# Patient Record
Sex: Male | Born: 1940 | Race: White | Hispanic: No | Marital: Married | Smoking: Former smoker
Health system: Southern US, Community
[De-identification: ages and names within clinical notes are randomized; demographics above are authoritative.]

## PROBLEM LIST (undated history)

## (undated) DIAGNOSIS — R7303 Prediabetes: Secondary | ICD-10-CM

## (undated) DIAGNOSIS — L821 Other seborrheic keratosis: Secondary | ICD-10-CM

## (undated) DIAGNOSIS — K648 Other hemorrhoids: Secondary | ICD-10-CM

## (undated) DIAGNOSIS — E785 Hyperlipidemia, unspecified: Secondary | ICD-10-CM

## (undated) DIAGNOSIS — R001 Bradycardia, unspecified: Secondary | ICD-10-CM

## (undated) DIAGNOSIS — K2270 Barrett's esophagus without dysplasia: Secondary | ICD-10-CM

## (undated) DIAGNOSIS — H8110 Benign paroxysmal vertigo, unspecified ear: Secondary | ICD-10-CM

## (undated) DIAGNOSIS — S8990XA Unspecified injury of unspecified lower leg, initial encounter: Secondary | ICD-10-CM

## (undated) DIAGNOSIS — N400 Enlarged prostate without lower urinary tract symptoms: Secondary | ICD-10-CM

## (undated) DIAGNOSIS — I4891 Unspecified atrial fibrillation: Secondary | ICD-10-CM

## (undated) DIAGNOSIS — M1990 Unspecified osteoarthritis, unspecified site: Secondary | ICD-10-CM

## (undated) DIAGNOSIS — K760 Fatty (change of) liver, not elsewhere classified: Secondary | ICD-10-CM

## (undated) DIAGNOSIS — G540 Brachial plexus disorders: Secondary | ICD-10-CM

## (undated) DIAGNOSIS — K621 Rectal polyp: Secondary | ICD-10-CM

## (undated) DIAGNOSIS — Z85828 Personal history of other malignant neoplasm of skin: Secondary | ICD-10-CM

## (undated) DIAGNOSIS — M0609 Rheumatoid arthritis without rheumatoid factor, multiple sites: Secondary | ICD-10-CM

## (undated) DIAGNOSIS — I10 Essential (primary) hypertension: Secondary | ICD-10-CM

## (undated) DIAGNOSIS — I493 Ventricular premature depolarization: Secondary | ICD-10-CM

## (undated) DIAGNOSIS — F325 Major depressive disorder, single episode, in full remission: Secondary | ICD-10-CM

## (undated) DIAGNOSIS — I452 Bifascicular block: Secondary | ICD-10-CM

## (undated) DIAGNOSIS — M65352 Trigger finger, left little finger: Secondary | ICD-10-CM

## (undated) DIAGNOSIS — Z950 Presence of cardiac pacemaker: Secondary | ICD-10-CM

## (undated) HISTORY — PX: SHOULDER SURGERY: SHX246

## (undated) HISTORY — DX: Essential (primary) hypertension: I10

## (undated) HISTORY — PX: INSERT / REPLACE / REMOVE PACEMAKER: SUR710

## (undated) HISTORY — PX: TONSILLECTOMY: SUR1361

---

## 2004-07-03 ENCOUNTER — Ambulatory Visit: Payer: Self-pay | Admitting: Gastroenterology

## 2004-12-22 ENCOUNTER — Ambulatory Visit: Payer: Self-pay | Admitting: Internal Medicine

## 2005-01-11 ENCOUNTER — Ambulatory Visit: Payer: Self-pay | Admitting: Internal Medicine

## 2006-12-24 ENCOUNTER — Emergency Department: Payer: Self-pay | Admitting: Emergency Medicine

## 2006-12-24 ENCOUNTER — Other Ambulatory Visit: Payer: Self-pay

## 2006-12-29 ENCOUNTER — Ambulatory Visit: Payer: Self-pay | Admitting: Emergency Medicine

## 2008-09-09 ENCOUNTER — Ambulatory Visit: Payer: Self-pay | Admitting: Unknown Physician Specialty

## 2008-10-09 ENCOUNTER — Emergency Department: Payer: Self-pay | Admitting: Internal Medicine

## 2009-07-25 ENCOUNTER — Ambulatory Visit: Payer: Self-pay | Admitting: Internal Medicine

## 2009-10-06 ENCOUNTER — Ambulatory Visit: Payer: Self-pay | Admitting: Unknown Physician Specialty

## 2010-03-31 ENCOUNTER — Ambulatory Visit: Payer: Self-pay | Admitting: Internal Medicine

## 2011-04-05 DIAGNOSIS — N320 Bladder-neck obstruction: Secondary | ICD-10-CM | POA: Insufficient documentation

## 2011-05-03 DIAGNOSIS — E782 Mixed hyperlipidemia: Secondary | ICD-10-CM | POA: Insufficient documentation

## 2011-08-29 DIAGNOSIS — M7520 Bicipital tendinitis, unspecified shoulder: Secondary | ICD-10-CM | POA: Insufficient documentation

## 2011-08-29 DIAGNOSIS — M19019 Primary osteoarthritis, unspecified shoulder: Secondary | ICD-10-CM | POA: Insufficient documentation

## 2011-08-29 DIAGNOSIS — M75122 Complete rotator cuff tear or rupture of left shoulder, not specified as traumatic: Secondary | ICD-10-CM | POA: Insufficient documentation

## 2012-02-01 DIAGNOSIS — M7502 Adhesive capsulitis of left shoulder: Secondary | ICD-10-CM | POA: Insufficient documentation

## 2012-02-15 DIAGNOSIS — G709 Myoneural disorder, unspecified: Secondary | ICD-10-CM | POA: Insufficient documentation

## 2012-06-03 DIAGNOSIS — G5600 Carpal tunnel syndrome, unspecified upper limb: Secondary | ICD-10-CM | POA: Insufficient documentation

## 2013-02-15 ENCOUNTER — Ambulatory Visit: Payer: Self-pay | Admitting: Internal Medicine

## 2013-03-09 ENCOUNTER — Ambulatory Visit (INDEPENDENT_AMBULATORY_CARE_PROVIDER_SITE_OTHER): Payer: Medicare Other | Admitting: Podiatry

## 2013-03-09 ENCOUNTER — Encounter: Payer: Self-pay | Admitting: Podiatry

## 2013-03-09 VITALS — BP 144/83 | HR 77 | Resp 16 | Ht 66.0 in | Wt 224.0 lb

## 2013-03-09 DIAGNOSIS — M779 Enthesopathy, unspecified: Secondary | ICD-10-CM

## 2013-03-09 NOTE — Patient Instructions (Signed)

## 2013-03-09 NOTE — Progress Notes (Signed)
Subjective:     Patient ID: [name], male   DOB: 13-Jan-1941, 72 y.o.   MRN: [id number]  HPI patient states that the pain is improving but still present if he walks a lot   Review of Systems  All other systems reviewed and are negative.       Objective:   Physical Exam  Nursing note and vitals reviewed. Constitutional: He appears well-developed and well-nourished.  Cardiovascular: Intact distal pulses.   Musculoskeletal: Normal range of motion.  Skin: Skin is warm.   Mild discomfort still noted second MPJ right with mild fluid buildup    Assessment:     Capsulitis second MPJ right still present    Plan:     Orthotics dispensed with instructions and advised on gradual increase in activity

## 2013-04-27 ENCOUNTER — Ambulatory Visit: Payer: Medicare Other | Admitting: Podiatry

## 2013-04-27 ENCOUNTER — Encounter: Payer: Self-pay | Admitting: Podiatry

## 2013-04-27 VITALS — BP 145/77 | HR 60 | Resp 16 | Ht 69.0 in | Wt 220.0 lb

## 2013-04-27 DIAGNOSIS — M779 Enthesopathy, unspecified: Secondary | ICD-10-CM

## 2013-04-27 DIAGNOSIS — B351 Tinea unguium: Secondary | ICD-10-CM

## 2013-04-27 MED ORDER — TRIAMCINOLONE ACETONIDE 10 MG/ML IJ SUSP
10.0000 mg | Freq: Once | INTRAMUSCULAR | Status: AC
  Administered 2013-04-27: 10 mg

## 2013-04-28 NOTE — Progress Notes (Signed)
Subjective:     Patient ID: [name], male   DOB: August 02, 1940, 72 y.o.   MRN: [id number]  HPI patient presents stating I am still having some soreness but the orthotics make a difference in my right foot   Review of Systems     Objective:   Physical Exam Neurovascular status intact with pain to palpation second MPJ right with tightness in the joint and obvious fluid accumulation    Assessment:     Capsulitis right second MPJ with possible damage the flexor plate and metatarsal    Plan:     Explained condition and possibility this may ultimately require surgery. Today patient wants injection and I went ahead and did proximal nerve block aspirated the joint and was able to apply a half cc of dexamethasone with Padding. Patient will reappoint in 6 weeks and if no improvement we'll need to consider surgery

## 2013-06-01 ENCOUNTER — Ambulatory Visit (INDEPENDENT_AMBULATORY_CARE_PROVIDER_SITE_OTHER): Payer: Medicare Other | Admitting: Podiatry

## 2013-06-01 ENCOUNTER — Encounter: Payer: Self-pay | Admitting: Podiatry

## 2013-06-01 VITALS — BP 129/82 | HR 52 | Resp 14

## 2013-06-01 DIAGNOSIS — M779 Enthesopathy, unspecified: Secondary | ICD-10-CM

## 2013-06-01 DIAGNOSIS — Q828 Other specified congenital malformations of skin: Secondary | ICD-10-CM

## 2013-06-01 MED ORDER — TRIAMCINOLONE ACETONIDE 10 MG/ML IJ SUSP
10.0000 mg | Freq: Once | INTRAMUSCULAR | Status: AC
  Administered 2013-06-01: 10 mg

## 2013-06-01 NOTE — Progress Notes (Signed)
Subjective:     Patient ID: [name], male   DOB: 11/02/1940, 73 y.o.   MRN: [id number]  HPI patient presents with lesion on the outside of the right foot that has become very painful. The area of the second MPJ has improved quite a bit with minimal discomfort   Review of Systems     Objective:   Physical Exam Neurovascular status intact with minimal pain second MPJ but quite a bit of pain in the fifth MPJ right with keratotic lesion formation x2    Assessment:     Inflamed capsulitis right fifth MPJ with porokeratotic type lesions and improving second MPJ inflammation    Plan:     Reviewed outside and did a careful steroid injection and after appropriate numbness debridement the lesion fully and applied a Band-Aid. Reappoint to recheck

## 2013-06-01 NOTE — Progress Notes (Signed)
   Subjective:    Patient ID: [name], male    DOB: [birth year], 73 y.o.   MRN: [id number]  HPI Comments: The side of the right foot where is was shaved before is very sore and tender , right lateral 5th met      Review of Systems     Objective:   Physical Exam        Assessment & Plan:

## 2013-06-08 ENCOUNTER — Encounter: Payer: Self-pay | Admitting: Podiatry

## 2013-06-08 ENCOUNTER — Ambulatory Visit (INDEPENDENT_AMBULATORY_CARE_PROVIDER_SITE_OTHER): Payer: Medicare Other | Admitting: Podiatry

## 2013-06-08 VITALS — BP 130/79 | HR 58 | Resp 16

## 2013-06-08 DIAGNOSIS — M779 Enthesopathy, unspecified: Secondary | ICD-10-CM

## 2013-06-08 NOTE — Progress Notes (Signed)
Subjective:     Patient ID: [name], male   DOB: April 07, 1941, 73 y.o.   MRN: [id number]  HPI patient states that he is having a lot of pain in the outside of his right foot and yesterday could not bear weight on   Review of Systems     Objective:   Physical Exam Neurovascular status intact with no other health history changes noted   with inflammation around the head of the fifth MPJ crusted tissue formation consistent with superficial slough of tissue Assessment:     Irritated tissue secondary to trauma with no fat tissue in this area    Plan:     Explained the patient and at this time applied padding around it along with Silvadene cream and reduced activity for the next 3 days. Should respond well

## 2013-09-18 ENCOUNTER — Ambulatory Visit: Payer: Medicare Other | Admitting: Podiatry

## 2013-09-18 VITALS — Resp 16 | Ht 69.0 in | Wt 218.0 lb

## 2013-09-18 DIAGNOSIS — L84 Corns and callosities: Secondary | ICD-10-CM

## 2013-09-18 DIAGNOSIS — M722 Plantar fascial fibromatosis: Secondary | ICD-10-CM

## 2013-09-18 DIAGNOSIS — M779 Enthesopathy, unspecified: Secondary | ICD-10-CM

## 2013-09-18 DIAGNOSIS — M79609 Pain in unspecified limb: Secondary | ICD-10-CM

## 2013-09-18 MED ORDER — TRIAMCINOLONE ACETONIDE 10 MG/ML IJ SUSP
10.0000 mg | Freq: Once | INTRAMUSCULAR | Status: AC
  Administered 2013-09-18: 10 mg

## 2013-09-18 NOTE — Progress Notes (Signed)
Subjective:     Patient ID: [name], male   DOB: June 22, 1940, 73 y.o.   MRN: [id number]  HPI patient presents stating I have a lesion on the bottom my right heel with heel pain and fluid buildup around fifth metatarsal right with lesion formation   Review of Systems     Objective:   Physical Exam Neurovascular status unchanged with pain in the plantar central heel right with small lesion and pain in the fifth MPJ right with fluid buildup in lesion formation    Assessment:     Capsulitis right fifth MPJ plantar fasciitis right heel and porokeratotic lesion formation    Plan:     Injected the plantar fascia 3 mg Kenalog 5 mm I can Marcaine mixture and the capsule right 3 mg dexamethasone Kenalog 5 mg Xylocaine and then debrided 2 lesions. Reappoint as needed

## 2014-02-21 ENCOUNTER — Ambulatory Visit: Payer: Self-pay | Admitting: Gastroenterology

## 2014-02-22 LAB — PATHOLOGY REPORT

## 2014-05-15 DIAGNOSIS — M1990 Unspecified osteoarthritis, unspecified site: Secondary | ICD-10-CM | POA: Insufficient documentation

## 2015-03-26 ENCOUNTER — Encounter: Payer: Self-pay | Admitting: *Deleted

## 2015-03-27 ENCOUNTER — Ambulatory Visit: Payer: Medicare Other | Admitting: Certified Registered Nurse Anesthetist

## 2015-03-27 ENCOUNTER — Ambulatory Visit
Admission: RE | Admit: 2015-03-27 | Discharge: 2015-03-27 | Disposition: A | Discharge status: Home / Self Care | Payer: Medicare Other | Source: Ambulatory Visit | Attending: Gastroenterology | Admitting: Gastroenterology

## 2015-03-27 ENCOUNTER — Encounter: Payer: Self-pay | Admitting: Anesthesiology

## 2015-03-27 ENCOUNTER — Encounter
Admission: RE | Disposition: A | Discharge status: Home / Self Care | Payer: Self-pay | Source: Ambulatory Visit | Attending: Gastroenterology

## 2015-03-27 DIAGNOSIS — Z87891 Personal history of nicotine dependence: Secondary | ICD-10-CM | POA: Diagnosis not present

## 2015-03-27 DIAGNOSIS — N400 Enlarged prostate without lower urinary tract symptoms: Secondary | ICD-10-CM | POA: Diagnosis not present

## 2015-03-27 DIAGNOSIS — K219 Gastro-esophageal reflux disease without esophagitis: Secondary | ICD-10-CM | POA: Insufficient documentation

## 2015-03-27 DIAGNOSIS — M1990 Unspecified osteoarthritis, unspecified site: Secondary | ICD-10-CM | POA: Diagnosis not present

## 2015-03-27 DIAGNOSIS — I10 Essential (primary) hypertension: Secondary | ICD-10-CM | POA: Diagnosis not present

## 2015-03-27 DIAGNOSIS — K2270 Barrett's esophagus without dysplasia: Secondary | ICD-10-CM | POA: Diagnosis not present

## 2015-03-27 HISTORY — DX: Prediabetes: R73.03

## 2015-03-27 HISTORY — DX: Barrett's esophagus without dysplasia: K22.70

## 2015-03-27 HISTORY — DX: Hyperlipidemia, unspecified: E78.5

## 2015-03-27 HISTORY — DX: Benign paroxysmal vertigo, unspecified ear: H81.10

## 2015-03-27 HISTORY — PX: ESOPHAGOGASTRODUODENOSCOPY (EGD) WITH PROPOFOL: SHX5813

## 2015-03-27 HISTORY — DX: Other seborrheic keratosis: L82.1

## 2015-03-27 HISTORY — DX: Bifascicular block: I45.2

## 2015-03-27 HISTORY — DX: Benign prostatic hyperplasia without lower urinary tract symptoms: N40.0

## 2015-03-27 HISTORY — DX: Unspecified injury of unspecified lower leg, initial encounter: S89.90XA

## 2015-03-27 HISTORY — DX: Unspecified osteoarthritis, unspecified site: M19.90

## 2015-03-27 SURGERY — ESOPHAGOGASTRODUODENOSCOPY (EGD) WITH PROPOFOL
Anesthesia: General

## 2015-03-27 MED ORDER — ONDANSETRON HCL 4 MG/2ML IJ SOLN: 4.0000 mg | Freq: Once | INTRAMUSCULAR | Status: DC | PRN

## 2015-03-27 MED ORDER — SODIUM CHLORIDE 0.9 % IV SOLN: INTRAVENOUS | Status: DC

## 2015-03-27 MED ORDER — LIDOCAINE HCL (CARDIAC) 20 MG/ML IV SOLN
INTRAVENOUS | Status: DC | PRN
  Administered 2015-03-27: 100 mg via INTRAVENOUS

## 2015-03-27 MED ORDER — FENTANYL CITRATE (PF) 100 MCG/2ML IJ SOLN: 25.0000 ug | INTRAMUSCULAR | Status: DC | PRN

## 2015-03-27 MED ORDER — SODIUM CHLORIDE 0.9 % IV SOLN
INTRAVENOUS | Status: DC
  Administered 2015-03-27: 1000 mL via INTRAVENOUS

## 2015-03-27 MED ORDER — PROPOFOL 500 MG/50ML IV EMUL
INTRAVENOUS | Status: DC | PRN
  Administered 2015-03-27: 140 ug/kg/min via INTRAVENOUS

## 2015-03-27 MED ORDER — PROPOFOL 10 MG/ML IV BOLUS
INTRAVENOUS | Status: DC | PRN
  Administered 2015-03-27: 50 mg via INTRAVENOUS

## 2015-03-27 MED ORDER — FENTANYL CITRATE (PF) 100 MCG/2ML IJ SOLN
INTRAMUSCULAR | Status: DC | PRN
  Administered 2015-03-27: 50 ug via INTRAVENOUS

## 2015-03-27 NOTE — Transfer of Care (Signed)
Immediate Anesthesia Transfer of Care Note  Patient: [name]  Procedure(s) Performed: Procedure(s): ESOPHAGOGASTRODUODENOSCOPY (EGD) WITH PROPOFOL (N/A)  Patient Location: PACU  Anesthesia Type:General  Level of Consciousness: sedated  Airway & Oxygen Therapy: Patient Spontanous Breathing and Patient connected to nasal cannula oxygen  Post-op Assessment: Report given to RN and Post -op Vital signs reviewed and stable  Post vital signs: Reviewed and stable  Last Vitals:  Filed Vitals:   03/27/15 1119  BP: 121/84  Pulse: 53  Temp: 35.8 C  Resp: 12    Complications: No apparent anesthesia complications

## 2015-03-27 NOTE — H&P (Signed)
  Date of Initial H&P:02/25/2015  History reviewed, patient examined, no change in status, stable for surgery. 

## 2015-03-27 NOTE — Anesthesia Preprocedure Evaluation (Addendum)
Anesthesia Evaluation  Patient identified by MRN, date of birth, ID band Patient awake    Reviewed: Allergy & Precautions, NPO status , Patient's Chart, lab work & pertinent test results  Airway Mallampati: II  TM Distance: >3 FB Neck ROM: Full    Dental no notable dental hx. (+) Caps   Pulmonary former smoker,    Pulmonary exam normal breath sounds clear to auscultation       Cardiovascular hypertension, Pt. on medications Normal cardiovascular exam+ dysrhythmias   RBBB with LAFB, hx of mbigeminy   Neuro/Psych    GI/Hepatic Neg liver ROS, GERD  Medicated and Controlled,  Endo/Other  negative endocrine ROS  Renal/GU negative Renal ROS     Musculoskeletal  (+) Arthritis , Osteoarthritis,    Abdominal Normal abdominal exam  (+)   Peds negative pediatric ROS (+)  Hematology negative hematology ROS (+)   Anesthesia Other Findings BPH  Reproductive/Obstetrics                            Anesthesia Physical Anesthesia Plan  ASA: III  Anesthesia Plan: General   Post-op Pain Management:    Induction: Intravenous  Airway Management Planned: Nasal Cannula  Additional Equipment:   Intra-op Plan:   Post-operative Plan:   Informed Consent: I have reviewed the patients History and Physical, chart, labs and discussed the procedure including the risks, benefits and alternatives for the proposed anesthesia with the patient or authorized representative who has indicated his/her understanding and acceptance.   Dental advisory given  Plan Discussed with: CRNA and Surgeon  Anesthesia Plan Comments:         Anesthesia Quick Evaluation

## 2015-03-27 NOTE — Op Note (Signed)
[facility] Patient Name: [name] Procedure Date: 03/27/2015 11:00 AM MRN: [id number] Account #: [id number] Date of Birth: 12-30-1940 Admit Type: Outpatient Age: 74 Room: [facility] ENDO ROOM 4 Gender: Male Note Status: Finalized Procedure:         Upper GI endoscopy Indications:       For therapy of esophageal reflux, Follow-up of Barrett's                     esophagus Providers:         Ezzard Standing. Bluford Kaufmann, MD Referring MD:      Marya Amsler. Dareen Piano, MD (Referring MD) Medicines:         Monitored Anesthesia Care Complications:     No immediate complications. Procedure:         Pre-Anesthesia Assessment:                    - Prior to the procedure, a History and Physical was                     performed, and patient medications, allergies and                     sensitivities were reviewed. The patient's tolerance of                     previous anesthesia was reviewed.                    - The risks and benefits of the procedure and the sedation                     options and risks were discussed with the patient. All                     questions were answered and informed consent was obtained.                    - After reviewing the risks and benefits, the patient was                     deemed in satisfactory condition to undergo the procedure.                    After obtaining informed consent, the endoscope was passed                     under direct vision. Throughout the procedure, the                     patient's blood pressure, pulse, and oxygen saturations                     were monitored continuously. The Endoscope was introduced                     through the mouth, and advanced to the second part of                     duodenum. The upper GI endoscopy was accomplished without                     difficulty. The patient tolerated the procedure well. Findings:      The esophagus and gastroesophageal junction were examined with  white       light and narrow band imaging (NBI) from a forward view and retroflexed       position. There were esophageal mucosal changes consistent with       long-segment Barrett's esophagus, extending from the upper extent of the       gastric folds which were at 41 cm from the incisors to the Z-line which       was at 36 cm from the incisors. The maximum longitudinal extent of these       esophageal mucosal changes was 5 cm in length. Mucosa was biopsied with       a cold forceps for histology. A total of 3 specimen bottles were sent to       pathology. 4 quadrant biopsies at 40cm, 38cm, and 36cm were taken.      The exam was otherwise without abnormality.      The entire examined stomach was normal.      The examined duodenum was normal. Impression:        - Esophageal mucosal changes consistent with long-segment                     Barrett's esophagus. Biopsied.                    - The examination was otherwise normal.                    - Normal stomach.                    - Normal examined duodenum. Recommendation:    - Discharge patient to home.                    - Await pathology results.                    - Observe patient's clinical course.                    - Continue present medications.                    - Repeat the upper endoscopy in 3 years for surveillance                     based on pathology results.                    - The findings and recommendations were discussed with the                     patient. Procedure Code(s): --- Professional ---                    [phone], Esophagogastroduodenoscopy, flexible, transoral;                     with biopsy, single or multiple Diagnosis Code(s): --- Professional ---                    K22.70, Barrett's esophagus without dysplasia                    K21.9, Gastro-esophageal reflux disease without esophagitis CPT copyright 2014 American Medical Association. All rights reserved. The codes documented in this report are  preliminary and upon coder review may  be revised to meet current  compliance requirements. Wallace Cullens, MD 03/27/2015 11:16:04 AM This report has been signed electronically. Number of Addenda: 0 Note Initiated On: 03/27/2015 11:00 AM      [facility]

## 2015-03-27 NOTE — Anesthesia Procedure Notes (Signed)
Performed by: Jayliah Benett Pre-anesthesia Checklist: Patient identified, Emergency Drugs available, Suction available, Patient being monitored and Timeout performed Patient Re-evaluated:Patient Re-evaluated prior to inductionOxygen Delivery Method: Nasal cannula Intubation Type: IV induction       

## 2015-03-28 NOTE — Anesthesia Postprocedure Evaluation (Signed)
  Anesthesia Post-op Note  Patient: [name]  Procedure(s) Performed: Procedure(s): ESOPHAGOGASTRODUODENOSCOPY (EGD) WITH PROPOFOL (N/A)  Anesthesia type:General  Patient location: PACU  Post pain: Pain level controlled  Post assessment: Post-op Vital signs reviewed, Patient's Cardiovascular Status Stable, Respiratory Function Stable, Patent Airway and No signs of Nausea or vomiting  Post vital signs: Reviewed and stable  Last Vitals:  Filed Vitals:   03/27/15 1159  BP: 142/87  Pulse: 51  Temp:   Resp: 20    Level of consciousness: awake, alert  and patient cooperative  Complications: No apparent anesthesia complications

## 2015-03-31 LAB — SURGICAL PATHOLOGY

## 2015-06-17 DIAGNOSIS — M65352 Trigger finger, left little finger: Secondary | ICD-10-CM | POA: Insufficient documentation

## 2015-06-17 DIAGNOSIS — M19031 Primary osteoarthritis, right wrist: Secondary | ICD-10-CM | POA: Insufficient documentation

## 2015-06-19 ENCOUNTER — Ambulatory Visit (INDEPENDENT_AMBULATORY_CARE_PROVIDER_SITE_OTHER): Payer: Medicare Other | Admitting: Podiatry

## 2015-06-19 ENCOUNTER — Encounter: Payer: Self-pay | Admitting: Podiatry

## 2015-06-19 ENCOUNTER — Ambulatory Visit (INDEPENDENT_AMBULATORY_CARE_PROVIDER_SITE_OTHER): Payer: Medicare Other

## 2015-06-19 DIAGNOSIS — M722 Plantar fascial fibromatosis: Secondary | ICD-10-CM | POA: Diagnosis not present

## 2015-06-19 MED ORDER — TRIAMCINOLONE ACETONIDE 10 MG/ML IJ SUSP
10.0000 mg | Freq: Once | INTRAMUSCULAR | Status: AC
  Administered 2015-06-19: 10 mg

## 2015-06-19 NOTE — Progress Notes (Signed)
Subjective:     Patient ID: [name], male   DOB: 30-Oct-1940, 75 y.o.   MRN: [id number]  HPI patient states I'm getting a lot of pain in the bottom of my heel and it just started around a week ago   Review of Systems     Objective:   Physical Exam Neurovascular status intact with plantar posterior pain right heel at the insertion tendon calcaneus with fluid buildup noted more on the medial central and lateral side    Assessment:     Proximal-type plantar fasciitis right    Plan:     Injection of the proximal fascia from the lateral side 3 Milligan Kenalog 5 g Xylocaine which was tolerated well and reappoint to recheck

## 2015-07-01 ENCOUNTER — Ambulatory Visit: Payer: Medicare Other | Admitting: Podiatry

## 2016-03-03 ENCOUNTER — Other Ambulatory Visit: Payer: Self-pay | Admitting: Rheumatology

## 2016-03-03 DIAGNOSIS — M25552 Pain in left hip: Secondary | ICD-10-CM

## 2016-03-03 DIAGNOSIS — M0609 Rheumatoid arthritis without rheumatoid factor, multiple sites: Secondary | ICD-10-CM

## 2016-03-16 ENCOUNTER — Ambulatory Visit
Admission: RE | Admit: 2016-03-16 | Discharge: 2016-03-16 | Disposition: A | Discharge status: Home / Self Care | Payer: Medicare Other | Source: Ambulatory Visit | Attending: Rheumatology | Admitting: Rheumatology

## 2016-03-16 DIAGNOSIS — M25552 Pain in left hip: Secondary | ICD-10-CM | POA: Diagnosis present

## 2016-03-16 DIAGNOSIS — M1612 Unilateral primary osteoarthritis, left hip: Secondary | ICD-10-CM | POA: Insufficient documentation

## 2016-03-16 DIAGNOSIS — M0609 Rheumatoid arthritis without rheumatoid factor, multiple sites: Secondary | ICD-10-CM | POA: Insufficient documentation

## 2016-03-17 ENCOUNTER — Other Ambulatory Visit: Payer: Self-pay | Admitting: Internal Medicine

## 2016-03-17 DIAGNOSIS — R17 Unspecified jaundice: Secondary | ICD-10-CM

## 2016-03-23 ENCOUNTER — Ambulatory Visit: Payer: Medicare Other

## 2016-03-30 ENCOUNTER — Ambulatory Visit (INDEPENDENT_AMBULATORY_CARE_PROVIDER_SITE_OTHER): Payer: Medicare Other | Admitting: Orthopaedic Surgery

## 2016-03-30 DIAGNOSIS — M1612 Unilateral primary osteoarthritis, left hip: Secondary | ICD-10-CM | POA: Diagnosis not present

## 2016-03-30 DIAGNOSIS — M7062 Trochanteric bursitis, left hip: Secondary | ICD-10-CM | POA: Diagnosis not present

## 2016-03-30 MED ORDER — LIDOCAINE HCL 1 % IJ SOLN
3.0000 mL | INTRAMUSCULAR | Status: AC | PRN
  Administered 2016-03-30: 3 mL

## 2016-03-30 MED ORDER — METHYLPREDNISOLONE ACETATE 40 MG/ML IJ SUSP
40.0000 mg | INTRAMUSCULAR | Status: AC | PRN
  Administered 2016-03-30: 40 mg via INTRA_ARTICULAR

## 2016-03-30 NOTE — Progress Notes (Signed)
Office Visit Note   Patient: [name]           Date of Birth: 09-Sep-1940           MRN: [id number] Visit Date: 03/30/2016              Requested by: Lauro Regulus, MD [address] [facility] [place], [place] [address] PCP: Lauro Regulus., MD   Assessment & Plan: Visit Diagnoses:  1. Unilateral primary osteoarthritis, left hip   2. Trochanteric bursitis, left hip     Plan: He tolerated the trochanteric bursa injection around his left hip well. I gave her handout on anterior hip replacement surgery as well as showed him a hip model and explained in detail with the surgery involves. Right now he is feeling well and he says is not detrimentally affecting his quality of life and activities daily living yet proceed with surgery. I like to see him back in 4 weeks' 0 is doing overall.  Follow-Up Instructions: Return in about 4 weeks (around 04/27/2016).   Orders:  Orders Placed This Encounter  Procedures  . Large Joint Injection/Arthrocentesis   No orders of the defined types were placed in this encounter.     Procedures: Large Joint Inj Date/Time: 03/30/2016 10:59 AM Performed by: Kathryne Hitch Authorized by: Kathryne Hitch   Indications:  Pain Location:  Hip Needle Size:  22 G Approach:  Lateral Ultrasound Guidance: No   Fluoroscopic Guidance: No   Arthrogram: No Medications:  3 mL lidocaine 1 %; 40 mg methylPREDNISolone acetate 40 MG/ML     Clinical Data: No additional findings.   Subjective: Chief Complaint  Patient presents with  . Left Hip - Pain    Pain in hip for about 4-6 months, pain has been worsening for about 3 weeks. Patient has had xray and MRI, and brought both. MRI on canopy.   He has had pain is been normal for several years now but it's only been bad for the last 4-6 months just for a few weeks now. He says he is pain-free today though. He does not ambulate with any assistive device. He  has rheumatoid arthritis and is on methotrexate. He cannot take anti-inflammatories due to the detrimentally affects on his blood pressure. HPI  Review of Systems  Negative for chest pain shortness breath fever chills nausea and vomiting Objective: Vital Signs: There were no vitals taken for this visit.  Physical Exam He is alert and oriented 3 in no acute distress Ortho Exam He has excellent range of motion of both his hips with essentially no pain today. He had does have some pain over trochanteric area and IT band on the left Specialty Comments:  No specialty comments available.  Imaging: No results found.  He has plain films and an MRI that both accompanying him today. These show end-stage arthritis of the left hip. He has lost the superior lateral joint space and essentially "bone on bone wear". There is periarticular osteophytes and sclerotic changes.  PMFS History: There are no active problems to display for this patient.  Past Medical History:  Diagnosis Date  . Arthritis   . Barrett's esophagus   . BPH (benign prostatic hyperplasia)   . BPPV (benign paroxysmal positional vertigo)   . HBP (high blood pressure)   . Hyperlipidemia   . Prediabetes   . RBBB (right bundle branch block with left anterior fascicular block)   . Seborrheic keratosis   .  Traumatic leg injury     No family history on file.  Past Surgical History:  Procedure Laterality Date  . ESOPHAGOGASTRODUODENOSCOPY (EGD) WITH PROPOFOL N/A 03/27/2015   Procedure: ESOPHAGOGASTRODUODENOSCOPY (EGD) WITH PROPOFOL;  Surgeon: Wallace Cullens, MD;  Location: [facility] ENDOSCOPY;  Service: Gastroenterology;  Laterality: N/A;  . SHOULDER SURGERY Bilateral    Social History   Occupational History  . Not on file.   Social History Main Topics  . Smoking status: Former Games developer  . Smokeless tobacco: Never Used     Comment: quit 30years   . Alcohol use Yes     Comment: social  . Drug use: No  . Sexual activity: Not on  file

## 2016-03-31 ENCOUNTER — Ambulatory Visit
Admission: RE | Admit: 2016-03-31 | Discharge: 2016-03-31 | Disposition: A | Discharge status: Home / Self Care | Payer: Medicare Other | Source: Ambulatory Visit | Attending: Internal Medicine | Admitting: Internal Medicine

## 2016-03-31 DIAGNOSIS — R17 Unspecified jaundice: Secondary | ICD-10-CM | POA: Diagnosis not present

## 2016-03-31 DIAGNOSIS — K760 Fatty (change of) liver, not elsewhere classified: Secondary | ICD-10-CM | POA: Insufficient documentation

## 2016-03-31 DIAGNOSIS — K824 Cholesterolosis of gallbladder: Secondary | ICD-10-CM | POA: Insufficient documentation

## 2016-04-12 ENCOUNTER — Ambulatory Visit (INDEPENDENT_AMBULATORY_CARE_PROVIDER_SITE_OTHER): Payer: Medicare Other | Admitting: Podiatry

## 2016-04-12 ENCOUNTER — Encounter: Payer: Self-pay | Admitting: Podiatry

## 2016-04-12 DIAGNOSIS — M778 Other enthesopathies, not elsewhere classified: Secondary | ICD-10-CM

## 2016-04-12 DIAGNOSIS — M722 Plantar fascial fibromatosis: Secondary | ICD-10-CM | POA: Diagnosis not present

## 2016-04-12 DIAGNOSIS — M779 Enthesopathy, unspecified: Secondary | ICD-10-CM

## 2016-04-12 DIAGNOSIS — M7752 Other enthesopathy of left foot: Secondary | ICD-10-CM | POA: Diagnosis not present

## 2016-04-12 NOTE — Progress Notes (Signed)
He presents today with chief complaint of pain to the left heel. States that he had a shot last year which eliminated his problems.  Objective: Vital signs are stable he is alert and oriented 3. Pulses are palpable. Pain on palpation medially continue tubercle of the left heel.  Assessment: Pain in limb second onychomycosis.  Plan: Injected his left heel today with Kenalog and local anesthetic. Follow-up with him as needed.

## 2016-04-27 ENCOUNTER — Ambulatory Visit (INDEPENDENT_AMBULATORY_CARE_PROVIDER_SITE_OTHER): Payer: Medicare Other | Admitting: Physician Assistant

## 2016-04-27 DIAGNOSIS — M7062 Trochanteric bursitis, left hip: Secondary | ICD-10-CM | POA: Diagnosis not present

## 2016-04-27 NOTE — Progress Notes (Signed)
   Office Visit Note   Patient: [name]           Date of Birth: 1941-02-10           MRN: [id number] Visit Date: 04/27/2016              Requested by: Lauro Regulus, MD [address] [facility] [place], [place] [address] PCP: Lauro Regulus., MD   Assessment & Plan: Visit Diagnoses:  1. Trochanteric bursitis, left hip     Plan: IT band stretching exercises reviewed with patient today. Follow-up with [place] on a when necessary basis. He did have a stitch in the subcapital region of his scalp from a dermatologic procedure was bothering him. Incision site was well-healed and had been performed over a month ago. Therefore I removed the single Monocryl stitch that was present. He tolerated this well.  Follow-Up Instructions: Return if symptoms worsen or fail to improve.   Orders:  No orders of the defined types were placed in this encounter.  No orders of the defined types were placed in this encounter.     Procedures: No procedures performed   Clinical Data: No additional findings.   Subjective: Chief Complaint  Patient presents with  . Left Hip - Follow-up    Patient states he is following up from left hip/thigh pain. States he is doing well since injection last visit    HPI He returns today follow-up status post left hip trochanteric injection. He states that the injection helped tremendously with his left lateral hip pain. He is having no groin pain since starting methotrexate. Review of Systems   Objective: Vital Signs: There were no vitals taken for this visit.  Physical Exam  Ortho Exam Good range of motion of both hips without pain. No tenderness over the trochanteric region of either hip. Specialty Comments:  No specialty comments available.  Imaging: No results found.   PMFS History: There are no active problems to display for this patient.  Past Medical History:  Diagnosis Date  . Arthritis   . Barrett's  esophagus   . BPH (benign prostatic hyperplasia)   . BPPV (benign paroxysmal positional vertigo)   . HBP (high blood pressure)   . Hyperlipidemia   . Prediabetes   . RBBB (right bundle branch block with left anterior fascicular block)   . Seborrheic keratosis   . Traumatic leg injury     No family history on file.  Past Surgical History:  Procedure Laterality Date  . ESOPHAGOGASTRODUODENOSCOPY (EGD) WITH PROPOFOL N/A 03/27/2015   Procedure: ESOPHAGOGASTRODUODENOSCOPY (EGD) WITH PROPOFOL;  Surgeon: Wallace Cullens, MD;  Location: [facility] ENDOSCOPY;  Service: Gastroenterology;  Laterality: N/A;  . SHOULDER SURGERY Bilateral    Social History   Occupational History  . Not on file.   Social History Main Topics  . Smoking status: Former Games developer  . Smokeless tobacco: Never Used     Comment: quit 30years   . Alcohol use Yes     Comment: social  . Drug use: No  . Sexual activity: Not on file

## 2016-05-31 ENCOUNTER — Encounter: Payer: Self-pay | Admitting: Podiatry

## 2016-05-31 ENCOUNTER — Ambulatory Visit (INDEPENDENT_AMBULATORY_CARE_PROVIDER_SITE_OTHER): Payer: Medicare Other | Admitting: Podiatry

## 2016-05-31 DIAGNOSIS — L600 Ingrowing nail: Secondary | ICD-10-CM

## 2016-05-31 DIAGNOSIS — M722 Plantar fascial fibromatosis: Secondary | ICD-10-CM | POA: Diagnosis not present

## 2016-05-31 MED ORDER — TRIAMCINOLONE ACETONIDE 10 MG/ML IJ SUSP
10.0000 mg | Freq: Once | INTRAMUSCULAR | Status: AC
  Administered 2016-05-31: 10 mg

## 2016-05-31 NOTE — Patient Instructions (Signed)

## 2016-06-02 ENCOUNTER — Ambulatory Visit: Payer: Medicare Other | Admitting: Podiatry

## 2016-06-02 NOTE — Progress Notes (Signed)
Subjective:     Patient ID: [name], male   DOB: May 25, 1940, 76 y.o.   MRN: [id number]  HPI patient presents with a painful ingrown toenail the right big toe lateral border that makes it hard to walk comfortably stating it's been present for several weeks and she's tried to soak it and trim it and left heel pain that he states gives him trouble at different times   Review of Systems     Objective:   Physical Exam Neurovascular status intact muscle strength adequate patient found have incurvated right hallux lateral border that's painful when pressed and makes shoe gear difficult. Patient's also found to have exquisite discomfort plantar aspect left heel    Assessment:     Ingrown toenail deformity right hallux lateral border chronic and painful in nature with no indications of infection and plantar fasciitis left    Plan:     H&P both conditions reviewed. I've recommended correction of the ingrown I explained procedure and risk and today infiltrated 60 mg I can Marcaine mixture remove the lateral border exposed matrix and applied phenol 3 applications 30 seconds followed by alcohol lavage and sterile dressing. I then gave instructions on soaks and for the left I did inject the plantar fascia 3 mg Kenalog 5 mg Xylocaine

## 2016-06-09 ENCOUNTER — Ambulatory Visit: Payer: Medicare Other | Admitting: Podiatry

## 2016-06-16 ENCOUNTER — Ambulatory Visit: Payer: Medicare Other | Admitting: Podiatry

## 2016-07-14 DIAGNOSIS — M0609 Rheumatoid arthritis without rheumatoid factor, multiple sites: Secondary | ICD-10-CM | POA: Insufficient documentation

## 2016-11-30 DIAGNOSIS — I491 Atrial premature depolarization: Secondary | ICD-10-CM | POA: Insufficient documentation

## 2016-12-31 ENCOUNTER — Telehealth (INDEPENDENT_AMBULATORY_CARE_PROVIDER_SITE_OTHER): Payer: Self-pay

## 2016-12-31 NOTE — Telephone Encounter (Signed)
Pt left voicemail requesting a call back to schedule an appt with Dr. Alvester Morin. Called pt back and got no answer and no vm option.

## 2017-01-04 ENCOUNTER — Telehealth (INDEPENDENT_AMBULATORY_CARE_PROVIDER_SITE_OTHER): Payer: Self-pay | Admitting: Radiology

## 2017-01-04 NOTE — Telephone Encounter (Signed)
error 

## 2017-01-12 ENCOUNTER — Encounter (INDEPENDENT_AMBULATORY_CARE_PROVIDER_SITE_OTHER): Payer: Self-pay | Admitting: Orthopaedic Surgery

## 2017-01-12 ENCOUNTER — Ambulatory Visit (INDEPENDENT_AMBULATORY_CARE_PROVIDER_SITE_OTHER): Payer: Medicare Other | Admitting: Physician Assistant

## 2017-01-12 DIAGNOSIS — M1612 Unilateral primary osteoarthritis, left hip: Secondary | ICD-10-CM | POA: Diagnosis not present

## 2017-01-12 NOTE — Progress Notes (Signed)
Office Visit Note   Patient: [name]           Date of Birth: Nov 29, 1940           MRN: [id number] Visit Date: 01/12/2017              Requested by: Lauro Regulus, MD [address] [facility] [place], [place] [address] PCP: Lauro Regulus, MD   Assessment & Plan: Visit Diagnoses:  1. Unilateral primary osteoarthritis, left hip     Plan: Patient's CBC and by Dr. Alvester Morin on Tuesday at that time we'll obtain an AP pelvis and a lateral view of the left hip. Dr. Magnus Ivan will review these films at that time and we may consider a intra-articular injection of the left hip.  Follow-Up Instructions: Return if symptoms worsen or fail to improve.   Orders:  No orders of the defined types were placed in this encounter.  No orders of the defined types were placed in this encounter.     Procedures: No procedures performed   Clinical Data: No additional findings.   Subjective: Chief Complaint  Patient presents with  . Left Hip - Pain    HPI  Mr. Keller 76 year old male that we have seen in the past for hip pain. On MRI back in October 2017 he had near bone-on-bone arthritis of the left hip and osteoarthritis of the right hip. He also had plain films that he brought to our office visit showed end-stage arthritis of the left hip. Having no real pain in the hip. He limits his activity because he just does not truly trust the hip. He states that he catches at times. He states left hip actually gave way this past Sunday and has in the past. He states his pain is manageable in the hip. He does have difficulty putting on shoes and socks on the left due to decreased range of motion of the hip. He's quit using the elliptical and stretching due to his left hip catching sensation and discomfort/pain. He is due to see Dr. Alvester Morin on Tuesday the 28th for evaluation for some right lower back pain  Review of Systems Please see history of present illness  otherwise negative  Objective: Vital Signs: There were no vitals taken for this visit.  Physical Exam  Constitutional: He is oriented to person, place, and time. He appears well-developed and well-nourished. He appears distressed.  Pulmonary/Chest: Effort normal.  Neurological: He is alert and oriented to person, place, and time.  Skin: He is not diaphoretic.  Psychiatric: He has a normal mood and affect.    Ortho Exam He has limited internal rotation of both hips left greater than right. No significant pain with internal and external rotation of either hip. Specialty Comments:  No specialty comments available.  Imaging: No results found.   PMFS History: There are no active problems to display for this patient.  Past Medical History:  Diagnosis Date  . Arthritis   . Barrett's esophagus   . BPH (benign prostatic hyperplasia)   . BPPV (benign paroxysmal positional vertigo)   . HBP (high blood pressure)   . Hyperlipidemia   . Prediabetes   . RBBB (right bundle branch block with left anterior fascicular block)   . Seborrheic keratosis   . Traumatic leg injury     No family history on file.  Past Surgical History:  Procedure Laterality Date  . ESOPHAGOGASTRODUODENOSCOPY (EGD) WITH PROPOFOL N/A 03/27/2015   Procedure:  ESOPHAGOGASTRODUODENOSCOPY (EGD) WITH PROPOFOL;  Surgeon: Wallace Cullens, MD;  Location: [facility] ENDOSCOPY;  Service: Gastroenterology;  Laterality: N/A;  . SHOULDER SURGERY Bilateral    Social History   Occupational History  . Not on file.   Social History Main Topics  . Smoking status: Former Games developer  . Smokeless tobacco: Never Used     Comment: quit 30years   . Alcohol use Yes     Comment: social  . Drug use: No  . Sexual activity: Not on file

## 2017-01-17 ENCOUNTER — Ambulatory Visit (INDEPENDENT_AMBULATORY_CARE_PROVIDER_SITE_OTHER): Payer: Medicare Other | Admitting: Orthopaedic Surgery

## 2017-01-17 ENCOUNTER — Telehealth (INDEPENDENT_AMBULATORY_CARE_PROVIDER_SITE_OTHER): Payer: Self-pay | Admitting: Orthopaedic Surgery

## 2017-01-17 NOTE — Telephone Encounter (Signed)
FYI  We were going to get xrays of hip when patient came to see Dr Alvester Morin but he cx appt with him.

## 2017-01-18 ENCOUNTER — Ambulatory Visit (INDEPENDENT_AMBULATORY_CARE_PROVIDER_SITE_OTHER): Payer: Medicare Other

## 2017-01-18 ENCOUNTER — Encounter (INDEPENDENT_AMBULATORY_CARE_PROVIDER_SITE_OTHER): Payer: Self-pay | Admitting: Physical Medicine and Rehabilitation

## 2017-01-18 ENCOUNTER — Ambulatory Visit (INDEPENDENT_AMBULATORY_CARE_PROVIDER_SITE_OTHER): Payer: Medicare Other | Admitting: Physical Medicine and Rehabilitation

## 2017-01-18 VITALS — BP 130/88 | HR 59

## 2017-01-18 DIAGNOSIS — M25552 Pain in left hip: Secondary | ICD-10-CM

## 2017-01-18 DIAGNOSIS — M545 Low back pain: Secondary | ICD-10-CM

## 2017-01-18 NOTE — Progress Notes (Deleted)
Right lower back and buttock pain. No numbness or tingling. Pain since December 22, 2016. Noticed pain after doing a lot of bending and yard work. Started taking prednisone last Friday for an inner ear issue and says that seems to have helped his back pain some. Uncomfortable after playing golf yesterday.

## 2017-01-27 ENCOUNTER — Encounter (INDEPENDENT_AMBULATORY_CARE_PROVIDER_SITE_OTHER): Payer: Self-pay | Admitting: Physical Medicine and Rehabilitation

## 2017-01-27 NOTE — Progress Notes (Signed)
[name] - 76 y.o. male MRN [id number]  Date of birth: Apr 25, 1941  Office Visit Note: Visit Date: 01/18/2017 PCP: Lauro Regulus, MD Referred by: Lauro Regulus, MD  Subjective: Chief Complaint  Patient presents with  . Lower Back - Pain  . Left Hip - Pain   HPI: [name] is a 76 year old gentleman who comes in today for evaluation of his right lower back pain. He actually sees Dr. Magnus Ivan in our office or his hip arthritis and hip pain. He recently sawGil Clark, P.A.-C for evaluation of his hips and they did want to get repeat left hip x-rays when he came from her visit today. He was in fact contemplating canceling her visit today and just having x-rays of his hips because his back pain has gotten somewhat better. Basically he came to see [place] because of an acquaintance that is seen in the past for her back. [name] is a history of rheumatoid arthritis. He reports right-sided back pain since August 1 of this year. He really noticed the pain after doing a lot of bending and yardwork. He was actually given prednisone last Friday for an inner ear issue and this seems to have helped him greatly. He was uncomfortable playing golf yesterday but he was able to play golf and continue to do his normal activities. He has not had any radicular pain down the legs or paresthesias. He has had a history of off and on back pain for years in general flareups. He has had no prior lumbar spine imaging or MRIs. He's had no prior lumbar surgery. He's had a history of bilateral shoulder problems with surgery. He has end-stage arthritis of the left hip and some degenerative change of the right hip and this is been managed by Dr. Magnus Ivan. Dr. Magnus Ivan actually came into the office visit today to talk to [name] about his hip x-rays. There is a reviewed below and he does have pretty extensive changes of the left hip. Right now he is having any real left hip or groin pain per se is limiting to  him. He really is getting along fairly well in terms of his hip at this point. Dr. Magnus Ivan as can do see him back on a routine basis for monitoring. He has not had prior hip injection. The right sided back pain is worse with standing and extension. It is worse with rotation. He gets relief at rest. He does continue to take gabapentin as well as high risk medication like methotrexate for his rheumatoid arthritis.    Review of Systems  Constitutional: Negative for chills, fever, malaise/fatigue and weight loss.  HENT: Negative for hearing loss and sinus pain.   Eyes: Negative for blurred vision, double vision and photophobia.  Respiratory: Negative for cough and shortness of breath.   Cardiovascular: Negative for chest pain, palpitations and leg swelling.  Gastrointestinal: Negative for abdominal pain, nausea and vomiting.  Genitourinary: Negative for flank pain.  Musculoskeletal: Positive for back pain and joint pain. Negative for myalgias.  Skin: Negative for itching and rash.  Neurological: Negative for tremors, focal weakness and weakness.  Endo/Heme/Allergies: Negative.   Psychiatric/Behavioral: Negative for depression.  All other systems reviewed and are negative.  Otherwise per HPI.  Assessment & Plan: Visit Diagnoses:  1. Low back pain, unspecified back pain laterality, unspecified chronicity, with sciatica presence unspecified   2. Pain in left hip     Plan: Findings:  Chronic history of bilateral hip arthritis left worse  than right with really end-stage changes to the joint. This is followed by Dr. Magnus Ivan who saw the patient today in our office visit. There are a holding pattern due to the patient having really lack of significant pain or limiting ability with the hip. Clearly if this were to progress him more hip and groin pain and limiting we would look at an intra-articular injection or possibly hip replacement through Dr. Magnus Ivan. We'll continue to monitor him for this and  they'll continue to monitor him somewhat for his back pain as well. We discussed at length his axial back pain which was worse recently. This is an exacerbation of really facet arthropathy and may be a small annular tear or strain sprain activity. We discussed the nature of sprain strain activity of the lower back and annular tears and facet arthropathy. I talked to him at length probably more than 25 minutes and will most 40 minute visit about his back. We talked about activity modification exercises. I don't think any medication changes are needed because he seems to have gotten somewhat better with time and prednisone which was given to him for his inner ear. Diagnostically the next step would be facet joint block versus specific physical therapy versus potential for MRI did declare itself is to be more of a stenosis pattern.    Meds & Orders: No orders of the defined types were placed in this encounter.   Orders Placed This Encounter  Procedures  . XR Lumbar Spine Complete  . XR HIP UNILAT W OR W/O PELVIS 1V LEFT    Follow-up: Return if symptoms worsen or fail to improve.   Procedures: No procedures performed  No notes on file   Clinical History: No specialty comments available.  He reports that he has quit smoking. He has never used smokeless tobacco. No results for input(s): HGBA1C, LABURIC in the last 8760 hours.  Objective:  VS:  HT:    WT:   BMI:     BP:130/88  HR:(!) 59bpm  TEMP: ( )  RESP:  Physical Exam  Constitutional: He is oriented to person, place, and time. He appears well-developed and well-nourished. No distress.  HENT:  Head: Normocephalic and atraumatic.  Nose: Nose normal.  Mouth/Throat: Oropharynx is clear and moist.  Eyes: Pupils are equal, round, and reactive to light. Conjunctivae are normal.  Neck: Normal range of motion. Neck supple.  Cardiovascular: Regular rhythm and intact distal pulses.   Pulmonary/Chest: Effort normal and breath sounds normal.    Abdominal: Soft. He exhibits no distension.  Musculoskeletal: He exhibits no deformity.  Patient is somewhat slow to rise from a seated position and does stand with a forward flexed lumbar spine. He is very stiff with extension but it does reproduce his pain with extension rotation to the right. He has no real pain over the greater trochanter but he does have some tenderness on the left greater trochanter more than right. He has stiffness with both hips with internal rotation but no frank groin pain or significant pain. Again he does lack range of motion particularly on the left. He has good distal strength without clonus.  Neurological: He is alert and oriented to person, place, and time. He exhibits normal muscle tone. Coordination normal.  Skin: Skin is warm. No rash noted.  Psychiatric: He has a normal mood and affect. His behavior is normal.  Nursing note and vitals reviewed.   Ortho Exam Imaging: No results found.  Past Medical/Family/Surgical/Social History: Medications & Allergies reviewed  per EMR Patient Active Problem List   Diagnosis Date Noted  . 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 Primary osteoarthritis of right wrist 06/17/2015  . Trigger little finger of left hand 06/17/2015  . Arthritis 05/15/2014  . Carpal tunnel syndrome 06/03/2012  . Neuromuscular disorder (HCC) 02/15/2012  . 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 Biceps tendinitis 08/29/2011  . Complete rupture of left rotator cuff 08/29/2011  . Hyperlipidemia, mixed 05/03/2011  . 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 Arthritis   . Barrett's esophagus   . BPH (benign prostatic hyperplasia)   . BPPV (benign paroxysmal positional vertigo)   . HBP (high blood pressure)   . Hyperlipidemia   . Prediabetes   . RBBB (right bundle  branch block with left anterior fascicular block)   . Seborrheic keratosis   . Traumatic leg injury    History reviewed. No pertinent family history. Past Surgical History:  Procedure Laterality Date  . ESOPHAGOGASTRODUODENOSCOPY (EGD) WITH PROPOFOL N/A 03/27/2015   Procedure: ESOPHAGOGASTRODUODENOSCOPY (EGD) WITH PROPOFOL;  Surgeon: Wallace Cullens, MD;  Location: [facility] ENDOSCOPY;  Service: Gastroenterology;  Laterality: N/A;  . SHOULDER SURGERY Bilateral    Social History   Occupational History  . Not on file.   Social History Main Topics  . Smoking status: Former Games developer  . Smokeless tobacco: Never Used     Comment: quit 30years   . Alcohol use Yes     Comment: social  . Drug use: No  . Sexual activity: Not on file

## 2017-04-18 ENCOUNTER — Other Ambulatory Visit: Payer: Self-pay | Admitting: Podiatry

## 2017-04-18 ENCOUNTER — Encounter: Payer: Self-pay | Admitting: Podiatry

## 2017-04-18 ENCOUNTER — Ambulatory Visit (INDEPENDENT_AMBULATORY_CARE_PROVIDER_SITE_OTHER): Payer: Medicare Other | Admitting: Podiatry

## 2017-04-18 ENCOUNTER — Ambulatory Visit (INDEPENDENT_AMBULATORY_CARE_PROVIDER_SITE_OTHER): Payer: Medicare Other

## 2017-04-18 DIAGNOSIS — M722 Plantar fascial fibromatosis: Secondary | ICD-10-CM | POA: Diagnosis not present

## 2017-04-18 DIAGNOSIS — M79671 Pain in right foot: Secondary | ICD-10-CM

## 2017-04-18 MED ORDER — TRIAMCINOLONE ACETONIDE 10 MG/ML IJ SUSP
10.0000 mg | Freq: Once | INTRAMUSCULAR | Status: AC
  Administered 2017-04-18: 10 mg

## 2017-04-18 NOTE — Patient Instructions (Signed)

## 2017-04-20 NOTE — Progress Notes (Signed)
Subjective:    Patient ID: [name], male   DOB: 76 y.o.   MRN: [id number]   HPI patient states she's developed a flareup in the bottom of his right heel for the last 5 days and before that it was doing well    ROS      Objective:  Physical Exam neurovascular status intact with exquisite discomfort plantar aspect right heel at the insertional point tendon calcaneus     Assessment:    Acute plantar fasciitis right     Plan:   Went ahead and injected the plantar fascia right 3 mg Kenalog 5 mill grams Xylocaine and instructed on physical therapy. Patient will be seen back as needed and may require shockwave if symptoms persist

## 2017-06-07 ENCOUNTER — Encounter: Payer: Medicare Other | Attending: Physician Assistant | Admitting: Physician Assistant

## 2017-06-07 DIAGNOSIS — I872 Venous insufficiency (chronic) (peripheral): Secondary | ICD-10-CM | POA: Diagnosis not present

## 2017-06-07 DIAGNOSIS — S81801A Unspecified open wound, right lower leg, initial encounter: Secondary | ICD-10-CM | POA: Diagnosis not present

## 2017-06-07 DIAGNOSIS — M0609 Rheumatoid arthritis without rheumatoid factor, multiple sites: Secondary | ICD-10-CM | POA: Diagnosis not present

## 2017-06-07 DIAGNOSIS — L97812 Non-pressure chronic ulcer of other part of right lower leg with fat layer exposed: Secondary | ICD-10-CM | POA: Diagnosis not present

## 2017-06-07 DIAGNOSIS — I10 Essential (primary) hypertension: Secondary | ICD-10-CM | POA: Diagnosis not present

## 2017-06-07 DIAGNOSIS — X58XXXA Exposure to other specified factors, initial encounter: Secondary | ICD-10-CM | POA: Insufficient documentation

## 2017-06-07 DIAGNOSIS — Z87891 Personal history of nicotine dependence: Secondary | ICD-10-CM | POA: Insufficient documentation

## 2017-06-07 NOTE — Progress Notes (Signed)
[name], [name] ([id number]) Visit Report for 06/07/2017 Abuse/Suicide Risk Screen Details Patient Name: [name], [name]. Date of Service: 06/07/2017 10:30 AM Medical Record Number: [id number] Patient Account Number: [id number] Date of Birth/Sex: April 24, 1941 (77 y.o. Male) Treating RN: Phillis Haggis Primary Care Ommie Degeorge: Einar Crow Other Clinician: Referring Shakesha Soltau: Einar Crow Treating Bertha Earwood/Extender: STONE III, HOYT Weeks in Treatment: 0 Abuse/Suicide Risk Screen Items Answer ABUSE/SUICIDE RISK SCREEN: Has anyone close to you tried to hurt or harm you recentlyo No Do you feel uncomfortable with anyone in your familyo No Has anyone forced you do things that you didnot want to doo No Do you have any thoughts of harming yourselfo No Patient displays signs or symptoms of abuse and/or neglect. No Electronic Signature(s) Signed: 06/07/2017 3:36:56 PM By: Alejandro Mulling Entered By: Alejandro Mulling on 06/07/2017 10:44:19 [name] ([id number]) -------------------------------------------------------------------------------- Activities of Daily Living Details Patient Name: [name], [name]. Date of Service: 06/07/2017 10:30 AM Medical Record Number: [id number] Patient Account Number: [id number] Date of Birth/Sex: 1940-05-27 (77 y.o. Male) Treating RN: Phillis Haggis Primary Care Henry Utsey: Einar Crow Other Clinician: Referring Shahara Hartsfield: Einar Crow Treating Rollyn Scialdone/Extender: STONE III, HOYT Weeks in Treatment: 0 Activities of Daily Living Items Answer Activities of Daily Living (Please select one for each item) Drive Automobile Completely Able Take Medications Completely Able Use Telephone Completely Able Care for Appearance Completely Able Use Toilet Completely Able Bath / Shower Completely Able Dress Self Completely Able Feed Self Completely Able Walk Completely Able Get In / Out Bed Completely Able Housework Completely  Able Prepare Meals Completely Able Handle Money Completely Able Shop for Self Completely Able Electronic Signature(s) Signed: 06/07/2017 3:36:56 PM By: Alejandro Mulling Entered By: Alejandro Mulling on 06/07/2017 10:44:35 [name] ([id number]) -------------------------------------------------------------------------------- Education Assessment Details Patient Name: [name] Date of Service: 06/07/2017 10:30 AM Medical Record Number: [id number] Patient Account Number: [id number] Date of Birth/Sex: [birth year] (77 y.o. Male) Treating RN: Phillis Haggis Primary Care Godfrey Tritschler: Einar Crow Other Clinician: Referring Jovin Fester: Einar Crow Treating Khalea Ventura/Extender: Linwood Dibbles, HOYT Weeks in Treatment: 0 Primary Learner Assessed: Patient Learning Preferences/Education Level/Primary Language Learning Preference: Explanation, Printed Material Highest Education Level: College or Above Preferred Language: English Cognitive Barrier Assessment/Beliefs Language Barrier: No Translator Needed: No Memory Deficit: No Emotional Barrier: No Cultural/Religious Beliefs Affecting Medical Care: No Physical Barrier Assessment Impaired Vision: Yes Glasses Impaired Hearing: No Decreased Hand dexterity: No Knowledge/Comprehension Assessment Knowledge Level: Medium Comprehension Level: Medium Ability to understand written Medium instructions: Ability to understand verbal Medium instructions: Motivation Assessment Anxiety Level: Calm Cooperation: Cooperative Education Importance: Acknowledges Need Interest in Health Problems: Asks Questions Perception: Coherent Willingness to Engage in Self- Medium Management Activities: Readiness to Engage in Self- Medium Management Activities: Electronic Signature(s) Signed: 06/07/2017 3:36:56 PM By: Alejandro Mulling Entered By: Alejandro Mulling on 06/07/2017 10:44:54 [name]  ([id number]) -------------------------------------------------------------------------------- Fall Risk Assessment Details Patient Name: [name]. Date of Service: 06/07/2017 10:30 AM Medical Record Number: [id number] Patient Account Number: [id number] Date of Birth/Sex: 06-30-1940 (77 y.o. Male) Treating RN: Phillis Haggis Primary Care Jamesen Stahnke: Einar Crow Other Clinician: Referring Ella Guillotte: Einar Crow Treating Jakylah Bassinger/Extender: Linwood Dibbles, HOYT Weeks in Treatment: 0 Fall Risk Assessment Items Have you had 2 or more falls in the last 12 monthso 0 No Have you had any fall that resulted in injury in the last 12 monthso 0 No FALL RISK ASSESSMENT: History of falling - immediate or within 3 months 0 No Secondary diagnosis 0 No Ambulatory aid None/bed rest/wheelchair/nurse 0 No Crutches/cane/walker 0  No Furniture 0 No IV Access/Saline Lock 0 No Gait/Training Normal/bed rest/immobile 0 No Weak 0 No Impaired 0 No Mental Status Oriented to own ability 0 Yes Electronic Signature(s) Signed: 06/07/2017 3:36:56 PM By: Alejandro Mulling Entered By: Alejandro Mulling on 06/07/2017 10:45:02 [name] ([id number]) -------------------------------------------------------------------------------- Foot Assessment Details Patient Name: [name]. Date of Service: 06/07/2017 10:30 AM Medical Record Number: [id number] Patient Account Number: [id number] Date of Birth/Sex: 1941-04-15 (77 y.o. Male) Treating RN: Phillis Haggis Primary Care Saraann Enneking: Einar Crow Other Clinician: Referring Rory Montel: Einar Crow Treating Griffith Santilli/Extender: STONE III, HOYT Weeks in Treatment: 0 Foot Assessment Items Site Locations + = Sensation present, - = Sensation absent, C = Callus, U = Ulcer R = Redness, W = Warmth, M = Maceration, PU = Pre-ulcerative lesion F = Fissure, S = Swelling, D = Dryness Assessment Right: Left: Other Deformity: No No Prior Foot  Ulcer: No No Prior Amputation: No No Charcot Joint: No No Ambulatory Status: Ambulatory Without Help Gait: Steady Electronic Signature(s) Signed: 06/07/2017 3:36:56 PM By: Alejandro Mulling Entered By: Alejandro Mulling on 06/07/2017 10:47:13 [name] ([id number]) -------------------------------------------------------------------------------- Nutrition Risk Assessment Details Patient Name: [name]. Date of Service: 06/07/2017 10:30 AM Medical Record Number: [id number] Patient Account Number: [id number] Date of Birth/Sex: 1941-01-20 (77 y.o. Male) Treating RN: Phillis Haggis Primary Care Kerrion Kemppainen: Einar Crow Other Clinician: Referring Kanda Deluna: Einar Crow Treating Brinton Brandel/Extender: STONE III, HOYT Weeks in Treatment: 0 Height (in): 69 Weight (lbs): 219.1 Body Mass Index (BMI): 32.4 Nutrition Risk Assessment Items NUTRITION RISK SCREEN: I have an illness or condition that made me change the kind and/or amount of 0 No food I eat I eat fewer than two meals per day 0 No I eat few fruits and vegetables, or milk products 0 No I have three or more drinks of beer, liquor or wine almost every day 0 No I have tooth or mouth problems that make it hard for me to eat 0 No I don't always have enough money to buy the food I need 0 No I eat alone most of the time 0 No I take three or more different prescribed or over-the-counter drugs a day 1 Yes Without wanting to, I have lost or gained 10 pounds in the last six months 0 No I am not always physically able to shop, cook and/or feed myself 0 No Nutrition Protocols Good Risk Protocol 0 No interventions needed Moderate Risk Protocol Electronic Signature(s) Signed: 06/07/2017 3:36:56 PM By: Alejandro Mulling Entered By: Alejandro Mulling on 06/07/2017 10:45:14

## 2017-06-08 NOTE — Progress Notes (Signed)
[name] ([id number]) Visit Report for 06/07/2017 Chief Complaint Document Details Patient Name: [name], [name]. Date of Service: 06/07/2017 10:30 AM Medical Record Number: [id number] Patient Account Number: [id number] Date of Birth/Sex: [birth year] (77 y.o. Male) Treating RN: Phillis Haggis Primary Care Provider: Einar Crow Other Clinician: Referring Provider: Einar Crow Treating Provider/Extender: Linwood Dibbles, HOYT Weeks in Treatment: 0 Information Obtained from: Patient Chief Complaint Right Medial Lower Leg Traumatic Ulcer Electronic Signature(s) Signed: 06/07/2017 4:32:07 PM By: Lenda Kelp PA-C Entered By: Lenda Kelp on 06/07/2017 15:57:19 [name] ([id number]) -------------------------------------------------------------------------------- Debridement Details Patient Name: [name]. Date of Service: 06/07/2017 10:30 AM Medical Record Number: [id number] Patient Account Number: [id number] Date of Birth/Sex: February 26, 1941 (77 y.o. Male) Treating RN: Phillis Haggis Primary Care Provider: Einar Crow Other Clinician: Referring Provider: Einar Crow Treating Provider/Extender: Linwood Dibbles, HOYT Weeks in Treatment: 0 Debridement Performed for Wound #1 Right,Medial Lower Leg Assessment: Performed By: Physician STONE III, HOYT E., PA-C Debridement: Debridement Pre-procedure Verification/Time Yes - 11:25 Out Taken: Start Time: 11:26 Pain Control: Lidocaine 4% Topical Solution Level: Skin/Subcutaneous Tissue Total Area Debrided (L x W): 1.7 (cm) x 1.6 (cm) = 2.72 (cm) Tissue and other material Viable, Non-Viable, Exudate, Fibrin/Slough, Subcutaneous debrided: Instrument: Curette Bleeding: Minimum Hemostasis Achieved: Pressure End Time: 11:29 Procedural Pain: 0 Post Procedural Pain: 0 Response to Treatment: Procedure was tolerated well Post Debridement Measurements of Total Wound Length: (cm) 1.7 Width: (cm)  1.6 Depth: (cm) 0.2 Volume: (cm) 0.427 Character of Wound/Ulcer Post Debridement: Requires Further Debridement Post Procedure Diagnosis Same as Pre-procedure Electronic Signature(s) Signed: 06/07/2017 3:36:56 PM By: Alejandro Mulling Signed: 06/07/2017 4:32:07 PM By: Lenda Kelp PA-C Entered By: Alejandro Mulling on 06/07/2017 11:30:24 [name] ([id number]) -------------------------------------------------------------------------------- HPI Details Patient Name: [name]. Date of Service: 06/07/2017 10:30 AM Medical Record Number: [id number] Patient Account Number: [id number] Date of Birth/Sex: 1940/06/29 (77 y.o. 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 HPI Description: 06/07/17 on evaluation today patient presents concerning an injury which occurred on December 31 while playing golf in [place] to his right lower extremity. He tells me that he slipped on a railroad tie that he was climbing up to a tee box. With that being said he has had some definite discomfort in regard to the injury at the site since that point. He did go to urgent care on May 26, 2017 and at that point he was referred to the wound center as well as being treated with topical mupirocin as well as doxycycline. He states that the infection did seem to clear although there still little redness around the wound I explained this is not unusual. He has been tolerating the dressing changes which is basically mupirocin applied topically to the wound bed at this point. No fevers, chills, nausea, or vomiting noted at this time.At at this point patient has not had any  arterial studies that I can find in epic. Electronic Signature(s) Signed: 06/07/2017 4:32:07 PM By: Lenda Kelp PA-C Entered By: Lenda Kelp on 06/07/2017 16:02:27 [name] ([id number]) -------------------------------------------------------------------------------- Physical Exam Details Patient Name: [name] Date of Service: 06/07/2017 10:30 AM Medical Record Number: [id number] Patient Account Number: [id number] Date of Birth/Sex: 06-29-1940 (77 y.o. Male) Treating  RN: Phillis Haggis Primary Care Provider: Einar Crow Other Clinician: Referring Provider: Einar Crow Treating Provider/Extender: Linwood Dibbles, HOYT Weeks in Treatment: 0 Constitutional sitting or standing blood pressure is within target range for patient.. pulse regular and within target range for patient.Marland Kitchen respirations regular, non-labored and within target range for patient.Marland Kitchen temperature within target range for patient.. Well- nourished and well-hydrated in no acute distress. Eyes conjunctiva clear no eyelid edema noted. pupils equal round and reactive to light and accommodation. Ears, Nose, Mouth, and Throat no gross abnormality of ear auricles or external auditory canals. normal hearing noted during conversation. mucus membranes moist. Respiratory normal breathing without difficulty. clear to auscultation bilaterally. Cardiovascular regular rate and rhythm with normal S1, S2. 1+ dorsalis pedis/posterior tibialis pulses. no clubbing, cyanosis, significant edema, <3 sec cap refill. Gastrointestinal (GI) soft, non-tender, non-distended, +BS. no ventral hernia noted. Musculoskeletal normal gait and posture. no significant deformity or arthritic changes, no loss or range of motion, no clubbing. Psychiatric this patient is able to make decisions and demonstrates good insight into disease process. Alert and Oriented x 3. pleasant and cooperative. Notes Patient's wound does appear  to show evidence of some eschar and Slough covering at this point in time. In fact this is making it difficult to fully evaluate the wound unfortunately. There is no evidence of infection significantly at this point in patient's wound did required debridement and post debridement the wound bed appear to be doing excellent and was very superficial which is great news. He did have some discomfort with debridement but was able to tolerate this. Electronic Signature(s) Signed: 06/07/2017 4:32:07 PM By: Lenda Kelp PA-C Entered By: Lenda Kelp on 06/07/2017 16:03:44 [name] ([id number]) -------------------------------------------------------------------------------- Physician Orders Details Patient Name: [name] Date of Service: 06/07/2017 10:30 AM Medical Record Number: [id number] Patient Account Number: [id number] Date of Birth/Sex: 04-Jul-1940 (77 y.o. Male) Treating RN: Phillis Haggis Primary Care Provider: Einar Crow Other Clinician: Referring Provider: Einar Crow Treating Provider/Extender: Linwood Dibbles, HOYT Weeks in Treatment: 0 Verbal / Phone Orders: Yes Clinician: Ashok Cordia, Debi Read Back and Verified: Yes Diagnosis Coding Wound Cleansing Wound #1 Right,Medial Lower Leg o Clean wound with Normal Saline. o Cleanse wound with mild soap and water o May Shower, gently pat wound dry prior to applying new dressing. Anesthetic (add to Medication List) Wound #1 Right,Medial Lower Leg o Topical Lidocaine 4% cream applied to wound bed prior to debridement (In Clinic Only). Skin Barriers/Peri-Wound Care Wound #1 Right,Medial Lower Leg o Skin Prep Primary Wound Dressing Wound #1 Right,Medial Lower Leg o Silvercel Non-Adherent Secondary Dressing Wound #1 Right,Medial Lower Leg o Boardered Foam Dressing Dressing Change Frequency Wound #1 Right,Medial Lower Leg o Change dressing every other day. Follow-up Appointments Wound #1  Right,Medial Lower Leg o Return Appointment in 1 week. Edema Control Wound #1 Right,Medial Lower Leg o Elevate legs to the level of the heart and pump ankles as often as possible Additional Orders / Instructions Wound #1 Right,Medial Lower Leg o Vitamin A; Vitamin C, Zinc o Increase protein intake. Patient Medications MONTRAVIOUS, DESPAIN ([id number]) Allergies: NKDA Notifications Medication Indication Start End lidocaine DOSE 1 - topical 4 % cream - 1 cream topical Electronic Signature(s) Signed: 06/07/2017 3:36:56 PM By: Alejandro Mulling Signed: 06/07/2017 4:32:07 PM By: Lenda Kelp PA-C Entered By: Alejandro Mulling on 06/07/2017 11:31:44 [name] ([id number]) -------------------------------------------------------------------------------- Prescription 06/07/2017 Patient Name: [name]. Provider: Lenda Kelp PA-C Date of Birth: 05-17-1941 NPI#: [id number] Sex: Judie Petit DEA#: [id number]  Phone #: [phone] License #: Patient Address: [facility] [address] [facility] [place], [place] [address] [address], [address] [place], [place] [address] [phone] Allergies NKDA Medication Medication: Route: Strength: Form: lidocaine 4 % topical cream topical 4% cream Class: TOPICAL LOCAL ANESTHETICS Dose: Frequency / Time: Indication: 1 1 cream topical Number of Refills: Number of Units: 0 Generic Substitution: Start Date: End Date: One Time Use: Substitution Permitted No Note to Pharmacy: Signature(s): Date(s): Electronic Signature(s) Signed: 06/07/2017 3:36:56 PM By: Alejandro Mulling Signed: 06/07/2017 4:32:07 PM By: Lenda Kelp PA-C Entered By: Alejandro Mulling on 06/07/2017 11:31:45 [name] ([id number]) --------------------------------------------------------------------------------  Problem List Details Patient Name: [name]. Date of Service:  06/07/2017 10:30 AM Medical Record Number: [id number] Patient Account Number: [id number] Date of Birth/Sex: [birth year] (77 y.o. 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 Date of Service: 06/07/2017 10:30 AM Medical Record Number: [id number] Patient Account Number: [id number] Date of Birth/Sex: 1941/03/04 (77 y.o. 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 06/07/17 on evaluation today patient presents concerning an injury which occurred on December  31 while playing golf in [place] to his right lower extremity. He tells me that he slipped on a railroad tie that he was climbing up to a tee box. With that being said he has had some definite discomfort in regard to the injury at the site since that point. He did go to urgent care on May 26, 2017 and at that point he was referred to the wound center as well as being treated with topical mupirocin as well as doxycycline. He states that the infection did seem to clear although there still little redness around the wound I explained this is not unusual. He has been tolerating the dressing changes which is basically mupirocin applied topically to the wound bed at this point. No fevers, chills, nausea, or vomiting noted at this time.At at this point patient has not had any arterial studies that I can find in epic. Wound History Patient presents with 1 open wound that has been present for approximately 05/23/18. Patient has been treating wound in the following manner: mupericin. Laboratory tests have not been performed in the last month. Patient reportedly has not tested positive for an antibiotic resistant organism. Patient reportedly has not tested positive for osteomyelitis. Patient reportedly has not had testing performed to evaluate circulation in the legs. Patient experiences the following problems  associated with their wounds: swelling. Patient History Information obtained from Patient. Allergies NKDA Family History Cancer - Siblings,Maternal Grandparents, Heart Disease - Father,Mother, No family history of Diabetes, Hereditary Spherocytosis, Hypertension, Kidney Disease, Lung Disease, Seizures, Stroke, Thyroid Problems, Tuberculosis. Social History Former smoker - QUIT 35 YRS AGO, Marital Status - Married, Alcohol Use - Moderate, Drug Use - No History, Caffeine Use - Daily. Medical History Cardiovascular Patient has history of Hypertension - controlled Musculoskeletal BILL, YOHN ([id number]) Patient has history of Rheumatoid Arthritis Review of Systems (ROS) Constitutional Symptoms (General Health) The patient has no complaints or symptoms. Eyes Complains or has symptoms of Glasses / Contacts. Ear/Nose/Mouth/Throat BARRETTS ESOPHAGUS Hematologic/Lymphatic The patient has no complaints or symptoms. Respiratory The patient has no complaints or symptoms. Cardiovascular high cholesterol bradycardia Gastrointestinal GERD Endocrine The patient has no complaints or symptoms. Genitourinary BPH Immunological The patient has no complaints or symptoms. Integumentary (Skin) Complains or has symptoms of Wounds. Neurologic The patient has no complaints or symptoms. Oncologic The patient has no complaints or symptoms. Psychiatric The patient has no complaints or symptoms. Objective Constitutional sitting or standing blood pressure is within target range for patient.. pulse regular and within target range for patient.Marland Kitchen respirations regular, non-labored and within target range for patient.Marland Kitchen temperature within target range for patient.. Well- nourished and well-hydrated in no acute distress. Vitals Time Taken: 10:31 AM, Height: 69 in, Source: Stated, Weight: 219.1 lbs, Source: Measured, BMI: 32.4, Temperature: 98.3 F, Pulse: 56 bpm, Respiratory Rate: 18 breaths/min, Blood Pressure: 118/69 mmHg. Eyes conjunctiva clear no eyelid edema noted. pupils equal round and reactive to light and accommodation. Ears, Nose, Mouth, and Throat no gross abnormality of ear auricles or external auditory canals. normal hearing noted during conversation. mucus membranes moist. Respiratory normal breathing without difficulty. clear to auscultation bilaterally. LAURIS, SERVISS ([id number]) Cardiovascular regular rate and rhythm with normal S1, S2. 1+ dorsalis pedis/posterior tibialis pulses. no clubbing, cyanosis, significant edema, Gastrointestinal (GI) soft,  non-tender, non-distended, +BS. no ventral hernia noted. Musculoskeletal normal gait and posture. no significant deformity or arthritic changes, no loss or range of motion, no clubbing. Psychiatric this patient is able to make decisions and demonstrates good insight into disease process. Alert and Oriented x 3. pleasant and cooperative. General Notes: Patient's wound does appear to show evidence of some eschar and Slough covering at this point in time. In fact this is making it difficult to fully evaluate the wound unfortunately. There is no evidence of infection significantly at this point in patient's wound did required debridement and post debridement the wound bed appear to be doing excellent and was very superficial which is great news. He did have some discomfort with debridement but was able to tolerate this. Integumentary (Hair, Skin) Wound #1 status is Open. Original cause of wound was Trauma. The wound is located on the Right,Medial Lower Leg. The wound measures 1.7cm length x 1.6cm width x 0.1cm depth; 2.136cm^2 area and 0.214cm^3 volume. There is no tunneling or undermining noted. There is a large amount of purulent drainage noted. The wound margin is distinct with the outline attached to the wound base. There is no granulation within the wound bed. There is a large (67-100%) amount of necrotic tissue within the wound bed including Eschar. The periwound skin appearance exhibited: Erythema. The surrounding wound skin color is noted with erythema which is circumferential. Periwound temperature was noted as No Abnormality. The periwound has tenderness on palpation. 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 There was a Skin/Subcutaneous Tissue Debridement ([id number]) debridement with total area of 2.72 sq cm performed by STONE III, HOYT E., PA-C. with the following instrument(s): Curette to remove Viable and Non-Viable tissue/material including Exudate, Fibrin/Slough, and Subcutaneous after achieving pain control using Lidocaine 4% Topical Solution. A time out was conducted at 11:25, prior to the start of the procedure. A Minimum amount of bleeding was controlled with Pressure. The procedure was tolerated well with a pain level of 0 throughout and a pain level of 0 following the procedure. Post Debridement Measurements: 1.7cm length x 1.6cm width x 0.2cm depth; 0.427cm^3 volume. GWYNN, CROSSLEY ([id number]) Character of Wound/Ulcer Post Debridement requires further debridement. Post procedure Diagnosis Wound #1: Same as Pre-Procedure Plan Wound Cleansing: Wound #1 Right,Medial Lower Leg: Clean wound with Normal Saline. Cleanse wound with mild soap and water May Shower, gently pat wound dry prior to applying new dressing. Anesthetic (add to Medication List): Wound #1 Right,Medial Lower Leg: Topical Lidocaine 4% cream applied to wound bed prior to debridement (In Clinic Only). Skin Barriers/Peri-Wound Care: Wound #1 Right,Medial Lower Leg: Skin Prep Primary Wound Dressing: Wound #1 Right,Medial Lower Leg: Silvercel Non-Adherent Secondary Dressing: Wound #1 Right,Medial Lower Leg: Boardered Foam Dressing Dressing Change Frequency: Wound #1 Right,Medial Lower Leg: Change dressing every other day. Follow-up Appointments: Wound #1 Right,Medial Lower Leg: Return Appointment in 1 week. Edema Control: Wound #1 Right,Medial Lower Leg: Elevate legs to the level of the heart and pump ankles as often as possible Additional Orders /  Instructions: Wound #1 Right,Medial Lower Leg: Vitamin A; Vitamin C, Zinc Increase protein intake. The following medication(s) was prescribed: lidocaine topical 4 % cream 1 1 cream topical was prescribed at facility At this point I'm going to suggest that we initiate treatment since the wound bed does appear to be doing so well post debridement with a silver alginate dressing followed with a border foam. Patient is in agreement with this plan. We will see how things do over the next week with this treatment. Subsequently I'm also going to recommend that we refer him for arterial studies for the bilateral lower extremities secondary to the ABI findings. We will see were things stand in one weeks time. Please see above for specific wound care orders. We will see patient for re-evaluation in 1 week(s) here in the clinic. If anything worsens or changes patient will contact our office for additional recommendations. MODESTO, GANOE ([id number]) Electronic Signature(s) Signed: 06/07/2017 4:32:07 PM By: Lenda Kelp PA-C Entered By: Lenda Kelp on 06/07/2017 16:06:02 [name] ([id number]) -------------------------------------------------------------------------------- ROS/PFSH Details Patient Name: [name] Date of Service: 06/07/2017 10:30 AM Medical Record Number: [id number] Patient Account Number: [id number] Date of Birth/Sex: 09-07-1940 (77 y.o. Male) Treating RN: Phillis Haggis Primary Care Provider: Einar Crow Other Clinician: Referring Provider: Einar Crow Treating Provider/Extender: Linwood Dibbles, HOYT Weeks in Treatment: 0 Information Obtained From Patient Wound History Do you currently have one or more open woundso Yes How many open wounds do you currently haveo 1 Approximately how long have you had your woundso 05/23/18 How have you been treating your wound(s) until nowo mupericin Has your wound(s) ever  healed and then re-openedo No Have  you had any lab work done in the past montho No Have you tested positive for an antibiotic resistant organism (MRSA, VRE)o No Have you tested positive for osteomyelitis (bone infection)o No Have you had any tests for circulation on your legso No Have you had other problems associated with your woundso Swelling Eyes Complaints and Symptoms: Positive for: Glasses / Contacts Integumentary (Skin) Complaints and Symptoms: Positive for: Wounds Constitutional Symptoms (General Health) Complaints and Symptoms: No Complaints or Symptoms Ear/Nose/Mouth/Throat Complaints and Symptoms: Review of System Notes: BARRETTS ESOPHAGUS Hematologic/Lymphatic Complaints and Symptoms: No Complaints or Symptoms Respiratory Complaints and Symptoms: No Complaints or Symptoms Cardiovascular Complaints and Symptoms: Review of System Notes: high cholesterol ROWAN, BLAKER ([id number]) bradycardia Medical History: Positive for: Hypertension - controlled Gastrointestinal Complaints and Symptoms: Review of System Notes: GERD Endocrine Complaints and Symptoms: No Complaints or Symptoms Genitourinary Complaints and Symptoms: Review of System Notes: BPH Immunological Complaints and Symptoms: No Complaints or Symptoms Musculoskeletal Medical History: Positive for: Rheumatoid Arthritis Neurologic Complaints and Symptoms: No Complaints or Symptoms Oncologic Complaints and Symptoms: No Complaints or Symptoms Psychiatric Complaints and Symptoms: No Complaints or Symptoms Immunizations Pneumococcal Vaccine: Received Pneumococcal Vaccination: Yes Implantable Devices Family and Social History Cancer: Yes - Siblings,Maternal Grandparents; Diabetes: No; Heart Disease: Yes - Father,Mother; Hereditary Spherocytosis: No; Hypertension: No; Kidney Disease: No; Lung Disease: No; Seizures: No; Stroke: No; Thyroid Problems: No; Tuberculosis: No; Former smoker - QUIT 35 YRS AGO; Marital Status -  Married; Alcohol Use: Moderate; Drug Use: No History; Caffeine Use: Daily; Financial Concerns: No; Food, Clothing or Shelter Needs: No; Support System Lacking: No; RAFAEL, SALWAY. ([id number]) Transportation Concerns: No; Advanced Directives: No; Patient does not want information on Advanced Directives; Do not resuscitate: No; Living Will: Yes (Not Provided); Medical Power of Attorney: Yes Alona Bene (wife) (Not Provided) Electronic Signature(s) Signed: 06/07/2017 3:36:56 PM By: Alejandro Mulling Signed: 06/07/2017 4:32:07 PM By: Lenda Kelp PA-C Entered By: Alejandro Mulling on 06/07/2017 10:44:11 [name] ([id number]) -------------------------------------------------------------------------------- SuperBill Details Patient Name: [name]. Date of Service: 06/07/2017 Medical Record Number: [id number] Patient Account Number: [id number] Date of Birth/Sex: 12/31/1940 (77 y.o. 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, multiple sites Facility Procedures CPT4 Code Description: 72536644 99213 - WOUND CARE VISIT-LEV 3 EST PT Modifier: Quantity: 1 CPT4 Code Description: 03474259 11042 - DEB SUBQ TISSUE 20 SQ CM/< ICD-10 Diagnosis Description 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: h fat layer expo Quantity: 1 sed Physician Procedures CPT4 Code Description: [id number] WC PHYS LEVEL 3 o NEW PT ICD-10 Diagnosis Description S81.801A  Unspecified open wound, right lower leg, initial encounter 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

## 2017-06-08 NOTE — Progress Notes (Signed)
[name], [name] ([id number]) Visit Report for 06/07/2017 Allergy List Details Patient Name: [name], [name]. Date of Service: 06/07/2017 10:30 AM Medical Record Number: [id number] Patient Account Number: [id number] Date of Birth/Sex: January 28, 1941 (77 y.o. Male) Treating RN: Phillis Haggis Primary Care Jaziyah Gradel: Einar Crow Other Clinician: Referring Zori Benbrook: Einar Crow Treating Mariabella Nilsen/Extender: STONE III, HOYT Weeks in Treatment: 0 Allergies Active Allergies NKDA Allergy Notes Electronic Signature(s) Signed: 06/07/2017 3:36:56 PM By: Alejandro Mulling Entered By: Alejandro Mulling on 06/07/2017 10:34:59 [name] ([id number]) -------------------------------------------------------------------------------- Arrival Information Details Patient Name: [name] Date of Service: 06/07/2017 10:30 AM Medical Record Number: [id number] Patient Account Number: [id number] Date of Birth/Sex: 1940/06/25 (77 y.o. Male) Treating RN: Phillis Haggis Primary Care Freya Zobrist: Einar Crow Other Clinician: Referring Elienai Gailey: Einar Crow Treating Jalysa Swopes/Extender: Linwood Dibbles, HOYT Weeks in Treatment: 0 Visit Information Patient Arrived: Ambulatory Arrival Time: 10:30 Accompanied By: self Transfer Assistance: None Patient Identification Verified: Yes Secondary Verification Process Completed: Yes Patient Requires Transmission-Based No Precautions: Patient Has Alerts: No Electronic Signature(s) Signed: 06/07/2017 3:36:56 PM By: Alejandro Mulling Entered By: Alejandro Mulling on 06/07/2017 10:32:09 [name] ([id number]) -------------------------------------------------------------------------------- Clinic Level of Care Assessment Details Patient Name: [name] Date of Service: 06/07/2017 10:30 AM Medical Record Number: [id number] Patient Account Number: [id number] Date of Birth/Sex: [birth year] (77 y.o. Male) Treating RN: Phillis Haggis Primary Care Tyrone Pautsch: Einar Crow Other Clinician: Referring Wadie Liew: Einar Crow Treating Abrian Hanover/Extender: STONE III, HOYT Weeks in Treatment: 0 Clinic Level of Care Assessment Items TOOL 1 Quantity Score X - Use when EandM and Procedure is performed on INITIAL visit 1 0 ASSESSMENTS - Nursing Assessment / Reassessment X - General Physical Exam (combine w/ comprehensive assessment (listed just below) when 1 20 performed on new pt. evals) X- 1 25 Comprehensive Assessment (HX, ROS, Risk Assessments, Wounds Hx, etc.) ASSESSMENTS - Wound and Skin Assessment / Reassessment []  - Dermatologic / Skin Assessment (not related to wound area) 0 ASSESSMENTS - Ostomy and/or Continence Assessment and Care []  - Incontinence Assessment and Management 0 []  - 0 Ostomy Care Assessment and Management (repouching, etc.) PROCESS - Coordination of Care X - Simple Patient / Family Education for ongoing care 1 15 []  - 0 Complex (extensive) Patient / Family Education for ongoing care []  - 0 Staff obtains , Records, Test Results / Process Orders []  - 0 Staff telephones HHA, Nursing Homes / Clarify orders / etc []  - 0 Routine Transfer to another Facility (non-emergent condition) []  - 0 Routine Hospital Admission (non-emergent condition) X- 1 15 New Admissions / / Ordering NPWT, Apligraf, etc. []  - 0 Emergency Hospital Admission (emergent condition) PROCESS - Special Needs []  - Pediatric / Minor Patient Management 0 []  - 0 Isolation Patient Management []  - 0 Hearing / Language / Visual special needs []  - 0 Assessment of Community assistance (transportation, D/C planning, etc.) []  - 0 Additional assistance / Altered mentation []  - 0 Support Surface(s) Assessment (bed, cushion, seat, etc.) [name], [name]. ( ) INTERVENTIONS - Miscellaneous []  - External ear exam 0 []  - 0 Patient Transfer (multiple staff / / Similar  devices) []  - 0 Simple Staple / Suture removal (25 or less) []  - 0 Complex Staple / Suture removal (26 or more) []  - 0 Hypo/Hyperglycemic Management (do not check if billed separately) X- 1 15 Ankle / Brachial Index (ABI) - do not check if billed separately Has the patient been seen at the hospital within the last three years: Yes Total Score: 90  Level Of Care: New/Established - Level 3 Electronic Signature(s) Signed: 06/07/2017 3:36:56 PM By: Alejandro Mulling Entered By: Alejandro Mulling on 06/07/2017 13:20:19 [name] ([id number]) -------------------------------------------------------------------------------- Encounter Discharge Information Details Patient Name: [name]. Date of Service: 06/07/2017 10:30 AM Medical Record Number: [id number] Patient Account Number: [id number] Date of Birth/Sex: 1941/03/13 (77 y.o. Male) Treating RN: Phillis Haggis Primary Care Latona Krichbaum: Einar Crow Other Clinician: Referring Idalee Foxworthy: Einar Crow Treating Enslie Sahota/Extender: Linwood Dibbles, HOYT Weeks in Treatment: 0 Encounter Discharge Information Items Discharge Pain Level: 0 Discharge Condition: Stable Ambulatory Status: Ambulatory Discharge Destination: Home Transportation: Private Auto Accompanied By: self Schedule Follow-up Appointment: Yes Medication Reconciliation completed and No provided to Patient/Care Anandi Abramo: Provided on Clinical Summary of Care: 06/07/2017 Form Type Recipient Paper Patient RK Electronic Signature(s) Signed: 06/08/2017 11:52:14 AM By: Gwenlyn Perking Entered By: Gwenlyn Perking on 06/07/2017 11:41:50 [name] ([id number]) -------------------------------------------------------------------------------- Lower Extremity Assessment Details Patient Name: [name]. Date of Service: 06/07/2017 10:30 AM Medical Record Number: [id number] Patient Account Number: [id number] Date of Birth/Sex: 1940/07/06 (77 y.o.  Male) Treating RN: Phillis Haggis Primary Care Lesbia Ottaway: Einar Crow Other Clinician: Referring Artis Beggs: Einar Crow Treating Saanvi Hakala/Extender: STONE III, HOYT Weeks in Treatment: 0 Edema Assessment Assessed: [Left: No] [Right: No] Edema: [Left: Ye] [Right: s] Calf Left: Right: Point of Measurement: 33 cm From Medial Instep cm 37 cm Ankle Left: Right: Point of Measurement: 10 cm From Medial Instep cm 23.5 cm Vascular Assessment Pulses: Dorsalis Pedis Palpable: [Right:Yes] Posterior Tibial Palpable: [Right:Yes] Extremity colors, hair growth, and conditions: Extremity Color: [Right:Normal] Hair Growth on Extremity: [Right:Yes] Temperature of Extremity: [Right:Warm] Capillary Refill: [Right:< 3 seconds] Blood Pressure: Brachial: [Right:110] Dorsalis Pedis: [Left:Dorsalis Pedis: 162] Ankle: Posterior Tibial: [Left:Posterior Tibial: 180] [Right:1.64] Toe Nail Assessment Left: Right: Thick: Yes Discolored: Yes Deformed: No Improper Length and Hygiene: Yes Electronic Signature(s) Signed: 06/07/2017 3:36:56 PM By: Alejandro Mulling Entered By: Alejandro Mulling on 06/07/2017 10:56:30 [name] ([id number]) -------------------------------------------------------------------------------- Multi Wound Chart Details Patient Name: [name]. Date of Service: 06/07/2017 10:30 AM Medical Record Number: [id number] Patient Account Number: [id number] Date of Birth/Sex: 07-14-1940 (77 y.o. Male) Treating RN: Phillis Haggis Primary Care Marlen Koman: Einar Crow Other Clinician: Referring Daielle Melcher: Einar Crow Treating Marayah Higdon/Extender: STONE III, HOYT Weeks in Treatment: 0 Vital Signs Height(in): 69 Pulse(bpm): 56 Weight(lbs): 219.1 Blood Pressure(mmHg): 118/69 Body Mass Index(BMI): 32 Temperature(F): 98.3 Respiratory Rate 18 (breaths/min): Photos: [1:No Photos] [N/A:N/A] Wound Location: [1:Right Lower Leg - Medial]  [N/A:N/A] Wounding Event: [1:Trauma] [N/A:N/A] Primary Etiology: [1:Trauma, Other] [N/A:N/A] Comorbid History: [1:Hypertension, Rheumatoid Arthritis] [N/A:N/A] Date Acquired: [1:05/23/2017] [N/A:N/A] Weeks of Treatment: [1:0] [N/A:N/A] Wound Status: [1:Open] [N/A:N/A] Measurements L x W x D [1:1.7x1.6x0.1] [N/A:N/A] (cm) Area (cm) : [1:2.136] [N/A:N/A] Volume (cm) : [1:0.214] [N/A:N/A] Classification: [1:Partial Thickness] [N/A:N/A] Exudate Amount: [1:Large] [N/A:N/A] Exudate Type: [1:Purulent] [N/A:N/A] Exudate Color: [1:yellow, brown, green] [N/A:N/A] Wound Margin: [1:Distinct, outline attached] [N/A:N/A] Granulation Amount: [1:None Present (0%)] [N/A:N/A] Necrotic Amount: [1:Large (67-100%)] [N/A:N/A] Necrotic Tissue: [1:Eschar] [N/A:N/A] Exposed Structures: [1:Fascia: No Fat Layer (Subcutaneous Tissue) Exposed: No Tendon: No Muscle: No Joint: No Bone: No] [N/A:N/A] Epithelialization: [1:None] [N/A:N/A] Periwound Skin Texture: [1:No Abnormalities Noted] [N/A:N/A] Periwound Skin Moisture: [1:No Abnormalities Noted] [N/A:N/A] Periwound Skin Color: [1:Erythema: Yes] [N/A:N/A] Erythema Location: [1:Circumferential] [N/A:N/A] Temperature: [1:No Abnormality] [N/A:N/A] Tenderness on Palpation: [1:Yes] [N/A:N/A] Wound Preparation: [1:Ulcer Cleansing: Rinsed/Irrigated with Saline] [N/A:N/A] Topical Anesthetic Applied: Other: lidocaine 4% Treatment Notes Electronic Signature(s) Signed: 06/07/2017 3:36:56 PM By: Alejandro Mulling Entered By: Alejandro Mulling on 06/07/2017 11:02:46 [name] ([id number]) --------------------------------------------------------------------------------  Multi-Disciplinary Care Plan Details Patient Name: [name], [name]. Date of Service: 06/07/2017 10:30 AM Medical Record Number: [id number] Patient Account Number: [id number] Date of Birth/Sex: 18-Jan-1941 (77 y.o. Male) Treating RN: Phillis Haggis Primary Care Praise Dolecki: Einar Crow  Other Clinician: Referring Yliana Gravois: Einar Crow Treating Brittania Sudbeck/Extender: Linwood Dibbles, HOYT Weeks in Treatment: 0 Active Inactive ` Orientation to the Wound Care Program Nursing Diagnoses: Knowledge deficit related to the wound healing center program Goals: Patient/caregiver will verbalize understanding of the Wound Healing Center Program Date Initiated: 06/07/2017 Target Resolution Date: 07/02/2017 Goal Status: Active Interventions: Provide education on orientation to the wound center Notes: ` Pain, Acute or Chronic Nursing Diagnoses: Pain, acute or chronic: actual or potential Potential alteration in comfort, pain Goals: Patient/caregiver will verbalize adequate pain control between visits Date Initiated: 06/07/2017 Target Resolution Date: 10/01/2017 Goal Status: Active Interventions: Complete pain assessment as per visit requirements Notes: ` Wound/Skin Impairment Nursing Diagnoses: Impaired tissue integrity Knowledge deficit related to ulceration/compromised skin integrity Goals: Ulcer/skin breakdown will have a volume reduction of 80% by week 12 Date Initiated: 06/07/2017 Target Resolution Date: 09/24/2017 Goal Status: Active [name], [name] ([id number]) Interventions: Assess patient/caregiver ability to perform ulcer/skin care regimen upon admission and as needed Assess ulceration(s) every visit Notes: Electronic Signature(s) Signed: 06/07/2017 3:36:56 PM By: Alejandro Mulling Entered By: Alejandro Mulling on 06/07/2017 11:02:35 [name] ([id number]) -------------------------------------------------------------------------------- Pain Assessment Details Patient Name: [name]. Date of Service: 06/07/2017 10:30 AM Medical Record Number: [id number] Patient Account Number: [id number] Date of Birth/Sex: 06-11-1940 (77 y.o. Male) Treating RN: Phillis Haggis Primary Care Shamell Hittle: Einar Crow Other Clinician: Referring Dorothee Napierkowski:  Einar Crow Treating Thomos Domine/Extender: Linwood Dibbles, HOYT Weeks in Treatment: 0 Active Problems Location of Pain Severity and Description of Pain Patient Has Paino Yes Site Locations Pain Location: Pain in Ulcers Rate the pain. Current Pain Level: 5 Character of Pain Describe the Pain: Burning, Tender Pain Management and Medication Current Pain Management: Electronic Signature(s) Signed: 06/07/2017 3:36:56 PM By: Alejandro Mulling Entered By: Alejandro Mulling on 06/07/2017 10:32:26 [name] ([id number]) -------------------------------------------------------------------------------- Patient/Caregiver Education Details Patient Name: [name]. Date of Service: 06/07/2017 10:30 AM Medical Record Number: [id number] Patient Account Number: [id number] Date of Birth/Gender: 20-Apr-1941 (77 y.o. Male) Treating RN: Phillis Haggis Primary Care Physician: Einar Crow Other Clinician: Referring Physician: Einar Crow Treating Physician/Extender: Linwood Dibbles, HOYT Weeks in Treatment: 0 Education Assessment Education Provided To: Patient Education Topics Provided Nutrition: Handouts: Nutrition, Other: Vitamins, Protein Methods: Explain/Verbal Responses: State content correctly Welcome To The [facility]: Handouts: Welcome To The [facility] Methods: Explain/Verbal Responses: State content correctly Wound/Skin Impairment: Handouts: Caring for Your Ulcer, Other: change dressing as ordered Methods: Demonstration, Explain/Verbal Responses: State content correctly Electronic Signature(s) Signed: 06/07/2017 3:36:56 PM By: Alejandro Mulling Entered By: Alejandro Mulling on 06/07/2017 11:03:44 [name] ([id number]) -------------------------------------------------------------------------------- Wound Assessment Details Patient Name: [name]. Date of Service: 06/07/2017 10:30 AM Medical Record Number: [id number] Patient  Account Number: [id number] Date of Birth/Sex: [birth year] (77 y.o. Male) Treating RN: Phillis Haggis Primary Care Arcelia Pals: Einar Crow Other Clinician: Referring Alynna Hargrove: Einar Crow Treating Onetta Spainhower/Extender: STONE III, HOYT Weeks in Treatment: 0 Wound Status Wound Number: 1 Primary Etiology: Trauma, Other Wound Location: Right Lower Leg - Medial Wound Status: Open Wounding Event: Trauma Comorbid History: Hypertension, Rheumatoid Arthritis Date Acquired: 05/23/2017 Weeks Of Treatment: 0 Clustered Wound: No Photos Wound Measurements Length: (cm) 1.7 Width: (cm) 1.6 Depth: (cm) 0.1 Area: (cm) 2.136 Volume: (cm) 0.214 % Reduction in Area: 0% %  Reduction in Volume: 0% Epithelialization: None Tunneling: No Undermining: No Wound Description Full Thickness Without Exposed Support Foul Odo Classification: Structures Slough/F Wound Margin: Distinct, outline attached Exudate Large Amount: Exudate Type: Purulent Exudate Color: yellow, brown, green r After Cleansing: No ibrino Yes Wound Bed Granulation Amount: None Present (0%) Exposed Structure Necrotic Amount: Large (67-100%) Fascia Exposed: No Necrotic Quality: Eschar Fat Layer (Subcutaneous Tissue) Exposed: No Tendon Exposed: No Muscle Exposed: No Joint Exposed: No Bone Exposed: No Periwound Skin Texture [name], BRESEE. ([id number]) Texture Color No Abnormalities Noted: No No Abnormalities Noted: No Erythema: Yes Moisture Erythema Location: Circumferential No Abnormalities Noted: No Temperature / Pain Temperature: No Abnormality Tenderness on Palpation: Yes Wound Preparation Ulcer Cleansing: Rinsed/Irrigated with Saline Topical Anesthetic Applied: Other: lidocaine 4%, Treatment Notes Wound #1 (Right, Medial Lower Leg) 1. Cleansed with: Clean wound with Normal Saline 2. Anesthetic Topical Lidocaine 4% cream to wound bed prior to debridement 3. Peri-wound Care: Skin Prep 4.  Dressing Applied: Other dressing (specify in notes) 5. Secondary Dressing Applied Bordered Foam Dressing Notes silvercel Electronic Signature(s) Signed: 06/07/2017 4:08:41 PM By: Alejandro Mulling Signed: 06/07/2017 4:32:07 PM By: Lenda Kelp PA-C Previous Signature: 06/07/2017 3:36:56 PM Version By: Alejandro Mulling Entered By: Lenda Kelp on 06/07/2017 16:05:14 [name] ([id number]) -------------------------------------------------------------------------------- Vitals Details Patient Name: [name]. Date of Service: 06/07/2017 10:30 AM Medical Record Number: [id number] Patient Account Number: [id number] Date of Birth/Sex: 17-Mar-1941 (77 y.o. Male) Treating RN: Phillis Haggis Primary Care Bernardette Waldron: Einar Crow Other Clinician: Referring Marieelena Bartko: Einar Crow Treating Chanceler Pullin/Extender: STONE III, HOYT Weeks in Treatment: 0 Vital Signs Time Taken: 10:31 Temperature (F): 98.3 Height (in): 69 Pulse (bpm): 56 Source: Stated Respiratory Rate (breaths/min): 18 Weight (lbs): 219.1 Blood Pressure (mmHg): 118/69 Source: Measured Reference Range: 80 - 120 mg / dl Body Mass Index (BMI): 32.4 Electronic Signature(s) Signed: 06/07/2017 3:36:56 PM By: Alejandro Mulling Entered By: Alejandro Mulling on 06/07/2017 10:34:21

## 2017-06-14 ENCOUNTER — Encounter: Payer: Medicare Other | Admitting: Physician Assistant

## 2017-06-14 DIAGNOSIS — S81801A Unspecified open wound, right lower leg, initial encounter: Secondary | ICD-10-CM | POA: Diagnosis not present

## 2017-06-15 NOTE — Progress Notes (Signed)
Jonathan, Zuniga ([id number]) Visit Report for 2017/06/25 Chief Complaint Document Details Patient Name: [name], [name]. Date of Service: June 25, 2017 1:30 PM Medical Record Number: [id number] Patient Account Number: [id number] Date of Birth/Sex: [birth year] (77 y.o. Male) Treating RN: Phillis Haggis Primary Care Provider: Einar Crow Other Clinician: Referring Provider: Einar Crow Treating Provider/Extender: Linwood Dibbles, Camile Esters Weeks in Treatment: 1 Information Obtained from: Patient Chief Complaint Right Medial Lower Leg Traumatic Ulcer Electronic Signature(s) Signed: 06/15/2017 7:56:11 AM By: Lenda Kelp PA-C Entered By: Lenda Kelp on 2017-06-25 13:30:33 [name] ([id number]) -------------------------------------------------------------------------------- HPI Details Patient Name: [name] Date of Service: Jun 25, 2017 1:30 PM Medical Record Number: [id number] Patient Account Number: [id number] Date of Birth/Sex: 10/17/1940 (77 y.o. Male) Treating RN: Phillis Haggis Primary Care Provider: Einar Crow Other Clinician: Referring Provider: Einar Crow Treating Provider/Extender: Linwood Dibbles, Chia Mowers Weeks in Treatment: 1 History of Present Illness Associated Signs and Symptoms: Patient has a history of venous insufficiency which is chronic, hypertension, and rheumatoid arthritis at multiple sites. HPI Description: 06/07/17 on evaluation today patient presents concerning an injury which occurred on December 31 while playing golf in [place] to his right lower extremity. He tells me that he slipped on a railroad tie that he was climbing up to a tee box. With that being said he has had some definite discomfort in regard to the injury at the site since that point. He did go to urgent care on May 26, 2017 and at that point he was referred to the wound center as well as being treated with topical mupirocin as well as doxycycline. He  states that the infection did seem to clear although there still little redness around the wound I explained this is not unusual. He has been tolerating the dressing changes which is basically mupirocin applied topically to the wound bed at this point. No fevers, chills, nausea, or vomiting noted at this time.At at this point patient has not had any arterial studies that I can find in epic. 06-25-2017 on evaluation today patient appears to be completely healed in regard to his right medial ankle wound. He states that after debridement which was not the most comfortable thing in the world that he has done very well in regard to this wound and appears to have completely healed. He is having no pain at this point which is good news. No fevers, chills, nausea, or vomiting noted at this time. Electronic Signature(s) Signed: 06/15/2017 7:56:11 AM By: Lenda Kelp PA-C Entered By: Lenda Kelp on 2017-06-25 14:29:09 [name] ([id number]) -------------------------------------------------------------------------------- Physical Exam Details Patient Name: [name] Date of Service: 06-25-17 1:30 PM Medical Record Number: [id number] Patient Account Number: [id number] Date of Birth/Sex: Sep 21, 1940 (77 y.o. Male) Treating RN: Phillis Haggis Primary Care Provider: Einar Crow Other Clinician: Referring Provider: Einar Crow Treating Provider/Extender: STONE III, Rube Sanchez Weeks in Treatment: 1 Constitutional Well-nourished and well-hydrated in no acute distress. Respiratory normal breathing without difficulty. Psychiatric this patient is able to make decisions and demonstrates good insight into disease process. Alert and Oriented x 3. pleasant and cooperative. Notes Patient appears to be completely healed at the wound site. There is no evidence of infection, erythema, or any other abnormality. I'm very pleased with how quickly this closed over. Electronic  Signature(s) Signed: 06/15/2017 7:56:11 AM By: Lenda Kelp PA-C Entered By: Lenda Kelp on 25-Jun-2017 14:29:52 [name] ([id number]) -------------------------------------------------------------------------------- Physician Orders Details Patient Name: [name] Date of Service: June 25, 2017 1:30 PM  Medical Record Number: [id number] Patient Account Number: [id number] Date of Birth/Sex: Apr 24, 1941 (77 y.o. Male) Treating RN: Phillis Haggis Primary Care Provider: Einar Crow Other Clinician: Referring Provider: Einar Crow Treating Provider/Extender: Linwood Dibbles, Natash Berman Weeks in Treatment: 1 Verbal / Phone Orders: Yes Clinician: Ashok Cordia, Debi Read Back and Verified: Yes Diagnosis Coding ICD-10 Coding Code Description S81.801A Unspecified open wound, right lower leg, initial encounter I87.2 Venous insufficiency (chronic) (peripheral) L97.812 Non-pressure chronic ulcer of other part of right lower leg with fat layer exposed I10 Essential (primary) hypertension M06.09 Rheumatoid arthritis without rheumatoid factor, multiple sites Discharge From [facility] Services o Discharge from Wound Care Center - Keep area clean and dry. Keep moisturized with lotion. Please call our office if you have any questions or concerns. Electronic Signature(s) Signed: 2017-06-22 4:57:50 PM By: Alejandro Mulling Signed: 06/15/2017 7:56:11 AM By: Lenda Kelp PA-C Entered By: Alejandro Mulling on 06/22/2017 14:22:23 [name] ([id number]) -------------------------------------------------------------------------------- Problem List Details Patient Name: Jonathan, Zuniga. Date of Service: 2017-06-22 1:30 PM Medical Record Number: [id number] Patient Account Number: [id number] Date of Birth/Sex: June 03, 1940 (77 y.o. Male) Treating RN: Phillis Haggis Primary Care Provider: Einar Crow Other Clinician: Referring Provider: Einar Crow Treating  Provider/Extender: Linwood Dibbles, Shaunika Italiano Weeks in Treatment: 1 Active Problems ICD-10 Encounter Code Description Active Date Diagnosis S81.801A Unspecified open wound, right lower leg, initial encounter 06/07/2017 Yes I87.2 Venous insufficiency (chronic) (peripheral) 06/07/2017 Yes L97.812 Non-pressure chronic ulcer of other part of right lower leg with fat 06/07/2017 Yes layer exposed I10 Essential (primary) hypertension 06/07/2017 Yes M06.09 Rheumatoid arthritis without rheumatoid factor, multiple sites 06/07/2017 Yes Inactive Problems Resolved Problems Electronic Signature(s) Signed: 06/15/2017 7:56:11 AM By: Lenda Kelp PA-C Entered By: Lenda Kelp on 06/22/17 14:28:20 [name] ([id number]) -------------------------------------------------------------------------------- Progress Note Details Patient Name: [name]. Date of Service: 06-22-17 1:30 PM Medical Record Number: [id number] Patient Account Number: [id number] Date of Birth/Sex: [birth year] (77 y.o. Male) Treating RN: Phillis Haggis Primary Care Provider: Einar Crow Other Clinician: Referring Provider: Einar Crow Treating Provider/Extender: Linwood Dibbles, Gradyn Shein Weeks in Treatment: 1 Subjective Chief Complaint Information obtained from Patient Right Medial Lower Leg Traumatic Ulcer History of Present Illness (HPI) The following HPI elements were documented for the patient's wound: Associated Signs and Symptoms: Patient has a history of venous insufficiency which is chronic, hypertension, and rheumatoid arthritis at multiple sites. 06/07/17 on evaluation today patient presents concerning an injury which occurred on December 31 while playing golf in [place] to his right lower extremity. He tells me that he slipped on a railroad tie that he was climbing up to a tee box. With that being said he has had some definite discomfort in regard to the injury at the site since that point. He did go  to urgent care on May 26, 2017 and at that point he was referred to the wound center as well as being treated with topical mupirocin as well as doxycycline. He states that the infection did seem to clear although there still little redness around the wound I explained this is not unusual. He has been tolerating the dressing changes which is basically mupirocin applied topically to the wound bed at this point. No fevers, chills, nausea, or vomiting noted at this time.At at this point patient has not had any arterial studies that I can find in epic. Jun 22, 2017 on evaluation today patient appears to be completely healed in regard to his right medial ankle wound. He states that after debridement which was not the most  comfortable thing in the world that he has done very well in regard to this wound and appears to have completely healed. He is having no pain at this point which is good news. No fevers, chills, nausea, or vomiting noted at this time. Patient History Information obtained from Patient. Family History Cancer - Siblings,Maternal Grandparents, Heart Disease - Father,Mother, No family history of Diabetes, Hereditary Spherocytosis, Hypertension, Kidney Disease, Lung Disease, Seizures, Stroke, Thyroid Problems, Tuberculosis. Social History Former smoker - QUIT 35 YRS AGO, Marital Status - Married, Alcohol Use - Moderate, Drug Use - No History, Caffeine Use - Daily. Review of Systems (ROS) Constitutional Symptoms (General Health) The patient has no complaints or symptoms. Respiratory The patient has no complaints or symptoms. Cardiovascular The patient has no complaints or symptoms. Psychiatric The patient has no complaints or symptoms. JARAD, BARTH ([id number]) Objective Constitutional Well-nourished and well-hydrated in no acute distress. Vitals Time Taken: 1:35 PM, Height: 69 in, Weight: 219.1 lbs, BMI: 32.4, Temperature: 98.2 F, Pulse: 60 bpm, Respiratory Rate: 18  breaths/min, Blood Pressure: 129/66 mmHg. Respiratory normal breathing without difficulty. Psychiatric this patient is able to make decisions and demonstrates good insight into disease process. Alert and Oriented x 3. pleasant and cooperative. General Notes: Patient appears to be completely healed at the wound site. There is no evidence of infection, erythema, or any other abnormality. I'm very pleased with how quickly this closed over. Integumentary (Hair, Skin) Wound #1 status is Open. Original cause of wound was Trauma. The wound is located on the Right,Medial Lower Leg. The wound measures 0cm length x 0cm width x 0cm depth; 0cm^2 area and 0cm^3 volume. There is no tunneling or undermining noted. There is a none present amount of drainage noted. The wound margin is distinct with the outline attached to the wound base. There is no granulation within the wound bed. There is no necrotic tissue within the wound bed. The periwound skin appearance exhibited: Erythema. The surrounding wound skin color is noted with erythema which is circumferential. Periwound temperature was noted as No Abnormality. The periwound has tenderness on palpation. Assessment Active Problems ICD-10 S81.801A - Unspecified open wound, right lower leg, initial encounter I87.2 - Venous insufficiency (chronic) (peripheral) L97.812 - Non-pressure chronic ulcer of other part of right lower leg with fat layer exposed I10 - Essential (primary) hypertension M06.09 - Rheumatoid arthritis without rheumatoid factor, multiple sites Plan Discharge From [facility] Services: ZAK, GONDEK ([id number]) Discharge from Wound Care Center - Keep area clean and dry. Keep moisturized with lotion. Please call our office if you have any questions or concerns. At this point we're gonna discontinue wound care services as patient is completely healed I did recommend some lotion and just a protective dressing over this for a week or two to ensure  there is no repeat injury. He's in agreement this plan. We will see him in the future as needed if anything changes or worsens or he has any new ulcers which require evaluation and management. Electronic Signature(s) Signed: 06/15/2017 7:56:11 AM By: Lenda Kelp PA-C Entered By: Lenda Kelp on 06/14/2017 14:30:33 [name] ([id number]) -------------------------------------------------------------------------------- ROS/PFSH Details Patient Name: [name] Date of Service: 06/14/2017 1:30 PM Medical Record Number: [id number] Patient Account Number: [id number] Date of Birth/Sex: [birth year] (77 y.o. Male) Treating RN: Phillis Haggis Primary Care Provider: Einar Crow Other Clinician: Referring Provider: Einar Crow Treating Provider/Extender: Linwood Dibbles, Nyjah Schwake Weeks in Treatment: 1 Information Obtained From Patient Wound History Do you currently have one  or more open woundso Yes How many open wounds do you currently haveo 1 Approximately how long have you had your woundso 05/23/18 How have you been treating your wound(s) until nowo mupericin Has your wound(s) ever healed and then re-openedo No Have you had any lab work done in the past montho No Have you tested positive for an antibiotic resistant organism (MRSA, VRE)o No Have you tested positive for osteomyelitis (bone infection)o No Have you had any tests for circulation on your legso No Have you had other problems associated with your woundso Swelling Constitutional Symptoms (General Health) Complaints and Symptoms: No Complaints or Symptoms Respiratory Complaints and Symptoms: No Complaints or Symptoms Cardiovascular Complaints and Symptoms: No Complaints or Symptoms Medical History: Positive for: Hypertension - controlled Musculoskeletal Medical History: Positive for: Rheumatoid Arthritis Psychiatric Complaints and Symptoms: No Complaints or Symptoms Immunizations Pneumococcal  Vaccine: Received Pneumococcal Vaccination: Yes Implantable Devices Family and Social History WETZEL, MEESTER ([id number]) Cancer: Yes - Siblings,Maternal Grandparents; Diabetes: No; Heart Disease: Yes - Father,Mother; Hereditary Spherocytosis: No; Hypertension: No; Kidney Disease: No; Lung Disease: No; Seizures: No; Stroke: No; Thyroid Problems: No; Tuberculosis: No; Former smoker - QUIT 35 YRS AGO; Marital Status - Married; Alcohol Use: Moderate; Drug Use: No History; Caffeine Use: Daily; Financial Concerns: No; Food, Clothing or Shelter Needs: No; Support System Lacking: No; Transportation Concerns: No; Advanced Directives: No; Patient does not want information on Advanced Directives; Do not resuscitate: No; Living Will: Yes (Not Provided); Medical Power of Attorney: Yes - Alona Bene (wife) (Not Provided) Physician Affirmation I have reviewed and agree with the above information. Electronic Signature(s) Signed: 06/14/2017 4:57:50 PM By: Alejandro Mulling Signed: 06/15/2017 7:56:11 AM By: Lenda Kelp PA-C Entered By: Lenda Kelp on 06/14/2017 14:29:34 [name] ([id number]) -------------------------------------------------------------------------------- SuperBill Details Patient Name: [name]. Date of Service: 06/14/2017 Medical Record Number: [id number] Patient Account Number: [id number] Date of Birth/Sex: Feb 22, 1941 (76 y.o. Male) Treating RN: Ashok Cordia, Debi Primary Care Provider: Einar Crow Other Clinician: Referring Provider: Einar Crow Treating Provider/Extender: Linwood Dibbles, Suren Payne Weeks in Treatment: 1 Diagnosis Coding ICD-10 Codes Code Description [phone] Unspecified open wound, right lower leg, initial encounter I87.2 Venous insufficiency (chronic) (peripheral) L97.812 Non-pressure chronic ulcer of other part of right lower leg with fat layer exposed I10 Essential (primary) hypertension M06.09 Rheumatoid arthritis without rheumatoid  factor, multiple sites Facility Procedures CPT4 Code: [id number] Description: [id number] - WOUND CARE VISIT-LEV 2 EST PT Modifier: Quantity: 1 Physician Procedures CPT4 Code Description: [id number] - WC PHYS LEVEL 2 - EST PT ICD-10 Diagnosis Description S81.801A Unspecified open wound, right lower leg, initial encounter I87.2 Venous insufficiency (chronic) (peripheral) L97.812 Non-pressure chronic ulcer of other  part of right lower leg wi I10 Essential (primary) hypertension Modifier: th fat layer expo Quantity: 1 sed Electronic Signature(s) Signed: 06/14/2017 2:54:51 PM By: Alejandro Mulling Signed: 06/15/2017 7:56:11 AM By: Lenda Kelp PA-C Entered By: Alejandro Mulling on 06/14/2017 14:54:50

## 2017-06-17 NOTE — Progress Notes (Signed)
TYREE, FLUHARTY ([id number]) Visit Report for 06/14/2017 Arrival Information Details Patient Name: [name], [name]. Date of Service: 06/14/2017 1:30 PM Medical Record Number: [id number] Patient Account Number: [id number] Date of Birth/Sex: 1941-02-27 (77 y.o. Male) Treating RN: Phillis Haggis Primary Care Deonna Krummel: Einar Crow Other Clinician: Referring Azalya Galyon: Einar Crow Treating Rozina Pointer/Extender: Linwood Dibbles, HOYT Weeks in Treatment: 1 Visit Information History Since Last Visit All ordered tests and consults were completed: No Patient Arrived: Ambulatory Added or deleted any medications: No Arrival Time: 13:35 Any new allergies or adverse reactions: No Accompanied By: self Had a fall or experienced change in No Transfer Assistance: None activities of daily living that may affect Patient Identification Verified: Yes risk of falls: Secondary Verification Process Completed: Yes Signs or symptoms of abuse/neglect since last visito No Patient Requires Transmission-Based No Hospitalized since last visit: No Precautions: Has Dressing in Place as Prescribed: Yes Patient Has Alerts: No Pain Present Now: No Electronic Signature(s) Signed: 06/14/2017 4:57:50 PM By: Alejandro Mulling Entered By: Alejandro Mulling on 06/14/2017 13:35:27 [name] ([id number]) -------------------------------------------------------------------------------- Clinic Level of Care Assessment Details Patient Name: [name] Date of Service: 06/14/2017 1:30 PM Medical Record Number: [id number] Patient Account Number: [id number] Date of Birth/Sex: 1940-07-13 (77 y.o. Male) Treating RN: Phillis Haggis Primary Care Aamori Mcmasters: Einar Crow Other Clinician: Referring Tynan Boesel: Einar Crow Treating Eswin Worrell/Extender: Linwood Dibbles, HOYT Weeks in Treatment: 1 Clinic Level of Care Assessment Items TOOL 4 Quantity Score X - Use when only an EandM is performed on  FOLLOW-UP visit 1 0 ASSESSMENTS - Nursing Assessment / Reassessment X - Reassessment of Co-morbidities (includes updates in patient status) 1 10 X- 1 5 Reassessment of Adherence to Treatment Plan ASSESSMENTS - Wound and Skin Assessment / Reassessment X - Simple Wound Assessment / Reassessment - one wound 1 5 []  - 0 Complex Wound Assessment / Reassessment - multiple wounds []  - 0 Dermatologic / Skin Assessment (not related to wound area) ASSESSMENTS - Focused Assessment []  - Circumferential Edema Measurements - multi extremities 0 []  - 0 Nutritional Assessment / Counseling / Intervention []  - 0 Lower Extremity Assessment (monofilament, tuning fork, pulses) []  - 0 Peripheral Arterial Disease Assessment (using hand held doppler) ASSESSMENTS - Ostomy and/or Continence Assessment and Care []  - Incontinence Assessment and Management 0 []  - 0 Ostomy Care Assessment and Management (repouching, etc.) PROCESS - Coordination of Care X - Simple Patient / Family Education for ongoing care 1 15 []  - 0 Complex (extensive) Patient / Family Education for ongoing care []  - 0 Staff obtains , Records, Test Results / Process Orders []  - 0 Staff telephones HHA, Nursing Homes / Clarify orders / etc []  - 0 Routine Transfer to another Facility (non-emergent condition) []  - 0 Routine Hospital Admission (non-emergent condition) []  - 0 New Admissions / / Ordering NPWT, Apligraf, etc. []  - 0 Emergency Hospital Admission (emergent condition) X- 1 10 Simple Discharge Coordination [name], [name] ( ) []  - 0 Complex (extensive) Discharge Coordination PROCESS - Special Needs []  - Pediatric / Minor Patient Management 0 []  - 0 Isolation Patient Management []  - 0 Hearing / Language / Visual special needs []  - 0 Assessment of Community assistance (transportation, D/C planning, etc.) []  - 0 Additional assistance / Altered mentation []  - 0 Support  Surface(s) Assessment (bed, cushion, seat, etc.) INTERVENTIONS - Wound Cleansing / Measurement X - Simple Wound Cleansing - one wound 1 5 []  - 0 Complex Wound Cleansing - multiple wounds X- 1 5 Wound Imaging (photographs -  any number of wounds) []  - 0 Wound Tracing (instead of photographs) []  - 0 Simple Wound Measurement - one wound []  - 0 Complex Wound Measurement - multiple wounds INTERVENTIONS - Wound Dressings []  - Small Wound Dressing one or multiple wounds 0 []  - 0 Medium Wound Dressing one or multiple wounds []  - 0 Large Wound Dressing one or multiple wounds []  - 0 Application of Medications - topical []  - 0 Application of Medications - injection INTERVENTIONS - Miscellaneous []  - External ear exam 0 []  - 0 Specimen Collection (cultures, biopsies, blood, body fluids, etc.) []  - 0 Specimen(s) / Culture(s) sent or taken to Lab for analysis []  - 0 Patient Transfer (multiple staff / Nurse, adult / Similar devices) []  - 0 Simple Staple / Suture removal (25 or less) []  - 0 Complex Staple / Suture removal (26 or more) []  - 0 Hypo / Hyperglycemic Management (close monitor of Blood Glucose) []  - 0 Ankle / Brachial Index (ABI) - do not check if billed separately X- 1 5 Vital Signs [name], Jonathan S. ([id number]) Has the patient been seen at the hospital within the last three years: Yes Total Score: 60 Level Of Care: New/Established - Level 2 Electronic Signature(s) Signed: 06/14/2017 4:57:50 PM By: Alejandro Mulling Entered By: Alejandro Mulling on 06/14/2017 14:54:42 [name] ([id number]) -------------------------------------------------------------------------------- Encounter Discharge Information Details Patient Name: [name]. Date of Service: 06/14/2017 1:30 PM Medical Record Number: [id number] Patient Account Number: [id number] Date of Birth/Sex: [birth year] (77 y.o. Male) Treating RN: Phillis Haggis Primary Care Perlie Stene: Einar Crow  Other Clinician: Referring Nassim Cosma: Einar Crow Treating Harleen Fineberg/Extender: Linwood Dibbles, HOYT Weeks in Treatment: 1 Encounter Discharge Information Items Discharge Pain Level: 0 Discharge Condition: Stable Ambulatory Status: Ambulatory Discharge Destination: Home Transportation: Private Auto Accompanied By: self Schedule Follow-up Appointment: Yes Medication Reconciliation completed and No provided to Patient/Care Joelee Snoke: Provided on Clinical Summary of Care: 06/14/2017 Form Type Recipient Paper Patient RK Electronic Signature(s) Signed: 06/16/2017 11:25:55 AM By: Gwenlyn Perking Previous Signature: 06/14/2017 1:53:52 PM Version By: Alejandro Mulling Entered By: Gwenlyn Perking on 06/14/2017 14:26:22 [name] ([id number]) -------------------------------------------------------------------------------- Lower Extremity Assessment Details Patient Name: [name]. Date of Service: 06/14/2017 1:30 PM Medical Record Number: [id number] Patient Account Number: [id number] Date of Birth/Sex: 1940/06/04 (76 y.o. Male) Treating RN: Phillis Haggis Primary Care Sarahmarie Leavey: Einar Crow Other Clinician: Referring Zlaty Alexa: Einar Crow Treating Adianna Darwin/Extender: Linwood Dibbles, HOYT Weeks in Treatment: 1 Vascular Assessment Pulses: Dorsalis Pedis Palpable: [Right:Yes] Posterior Tibial Extremity colors, hair growth, and conditions: Extremity Color: [Right:Normal] Temperature of Extremity: [Right:Warm] Capillary Refill: [Right:< 3 seconds] Toe Nail Assessment Left: Right: Thick: Yes Discolored: Yes Deformed: No Improper Length and Hygiene: Yes Electronic Signature(s) Signed: 06/14/2017 4:57:50 PM By: Alejandro Mulling Previous Signature: 06/14/2017 1:46:21 PM Version By: Alejandro Mulling Entered By: Alejandro Mulling on 06/14/2017 14:21:08 [name] ([id number]) -------------------------------------------------------------------------------- Multi  Wound Chart Details Patient Name: [name]. Date of Service: 06/14/2017 1:30 PM Medical Record Number: [id number] Patient Account Number: [id number] Date of Birth/Sex: 19-Feb-1941 (76 y.o. Male) Treating RN: Phillis Haggis Primary Care [place] Sorn: Einar Crow Other Clinician: Referring Lawerance Matsuo: Einar Crow Treating Pax Reasoner/Extender: STONE III, HOYT Weeks in Treatment: 1 Vital Signs Height(in): 69 Pulse(bpm): 60 Weight(lbs): 219.1 Blood Pressure(mmHg): 129/66 Body Mass Index(BMI): 32 Temperature(F): 98.2 Respiratory Rate 18 (breaths/min): Photos: [1:No Photos] [N/A:N/A] Wound Location: [1:Right Lower Leg - Medial] [N/A:N/A] Wounding Event: [1:Trauma] [N/A:N/A] Primary Etiology: [1:Trauma, Other] [N/A:N/A] Comorbid History: [1:Hypertension, Rheumatoid Arthritis] [N/A:N/A] Date Acquired: [1:05/23/2017] [N/A:N/A] Weeks of Treatment: [  1:1] [N/A:N/A] Wound Status: [1:Open] [N/A:N/A] Measurements L x W x D [1:0x0x0] [N/A:N/A] (cm) Area (cm) : [1:0] [N/A:N/A] Volume (cm) : [1:0] [N/A:N/A] % Reduction in Area: [1:100.00%] [N/A:N/A] % Reduction in Volume: [1:100.00%] [N/A:N/A] Classification: [1:Full Thickness Without Exposed Support Structures] [N/A:N/A] Exudate Amount: [1:None Present] [N/A:N/A] Wound Margin: [1:Distinct, outline attached] [N/A:N/A] Granulation Amount: [1:None Present (0%)] [N/A:N/A] Necrotic Amount: [1:None Present (0%)] [N/A:N/A] Exposed Structures: [1:Fascia: No Fat Layer (Subcutaneous Tissue) Exposed: No Tendon: No Muscle: No Joint: No Bone: No] [N/A:N/A] Epithelialization: [1:Large (67-100%)] [N/A:N/A] Periwound Skin Texture: [1:No Abnormalities Noted] [N/A:N/A] Periwound Skin Moisture: [1:No Abnormalities Noted] [N/A:N/A] Periwound Skin Color: [1:Erythema: Yes] [N/A:N/A] Erythema Location: [1:Circumferential] [N/A:N/A] Temperature: [1:No Abnormality] [N/A:N/A] Tenderness on Palpation: [1:Yes] [N/A:N/A] Wound  Preparation: [1:Ulcer Cleansing: Rinsed/Irrigated with Saline] [N/A:N/A] Topical Anesthetic Applied: None Treatment Notes Electronic Signature(s) Signed: 06/14/2017 1:46:36 PM By: Alejandro Mulling Entered By: Alejandro Mulling on 06/14/2017 13:46:36 [name] ([id number]) -------------------------------------------------------------------------------- Multi-Disciplinary Care Plan Details Patient Name: [name]. Date of Service: 06/14/2017 1:30 PM Medical Record Number: [id number] Patient Account Number: [id number] Date of Birth/Sex: Jul 18, 1940 (77 y.o. Male) Treating RN: Phillis Haggis Primary Care Seng Larch: Einar Crow Other Clinician: Referring Malessa Zartman: Einar Crow Treating Duron Meister/Extender: Linwood Dibbles, HOYT Weeks in Treatment: 1 Active Inactive Electronic Signature(s) Signed: 06/14/2017 4:57:50 PM By: Alejandro Mulling Previous Signature: 06/14/2017 1:46:26 PM Version By: Alejandro Mulling Entered By: Alejandro Mulling on 06/14/2017 14:21:29 [name] ([id number]) -------------------------------------------------------------------------------- Pain Assessment Details Patient Name: [name]. Date of Service: 06/14/2017 1:30 PM Medical Record Number: [id number] Patient Account Number: [id number] Date of Birth/Sex: [birth year] (77 y.o. Male) Treating RN: Phillis Haggis Primary Care Quinten Allerton: Einar Crow Other Clinician: Referring Mansi Tokar: Einar Crow Treating Kathlen Sakurai/Extender: Linwood Dibbles, HOYT Weeks in Treatment: 1 Active Problems Location of Pain Severity and Description of Pain Patient Has Paino No Site Locations Pain Management and Medication Current Pain Management: Electronic Signature(s) Signed: 06/14/2017 4:57:50 PM By: Alejandro Mulling Entered By: Alejandro Mulling on 06/14/2017 13:35:32 [name]  ([id number]) -------------------------------------------------------------------------------- Patient/Caregiver Education Details Patient Name: [name]. Date of Service: 06/14/2017 1:30 PM Medical Record Number: [id number] Patient Account Number: [id number] Date of Birth/Gender: [birth year] (76 y.o. Male) Treating RN: Phillis Haggis Primary Care Physician: Einar Crow Other Clinician: Referring Physician: Einar Crow Treating Physician/Extender: Linwood Dibbles, HOYT Weeks in Treatment: 1 Education Assessment Education Provided To: Patient Education Topics Provided Wound/Skin Impairment: Handouts: Caring for Your Ulcer, Other: Please call our office if you have any questions or concerns. Methods: Explain/Verbal Responses: State content correctly Electronic Signature(s) Signed: 06/14/2017 4:57:50 PM By: Alejandro Mulling Entered By: Alejandro Mulling on 06/14/2017 14:22:48 [name] ([id number]) -------------------------------------------------------------------------------- Wound Assessment Details Patient Name: [name]. Date of Service: 06/14/2017 1:30 PM Medical Record Number: [id number] Patient Account Number: [id number] Date of Birth/Sex: [birth year] (77 y.o. Male) Treating RN: Phillis Haggis Primary Care Shawnmichael Parenteau: Einar Crow Other Clinician: Referring Allicia Culley: Einar Crow Treating Blaine Hari/Extender: STONE III, HOYT Weeks in Treatment: 1 Wound Status Wound Number: 1 Primary Etiology: Trauma, Other Wound Location: Right Lower Leg - Medial Wound Status: Open Wounding Event: Trauma Comorbid History: Hypertension, Rheumatoid Arthritis Date Acquired: 05/23/2017 Weeks Of Treatment: 1 Clustered Wound: No Photos Photo Uploaded By: Alejandro Mulling on 06/14/2017 17:00:10 Wound Measurements Length: (cm) 0 Width: (cm) 0 Depth: (cm) 0 Area: (cm) 0 Volume: (cm) 0 % Reduction in Area: 100% % Reduction in Volume:  100% Epithelialization: Large (67-100%) Tunneling: No Undermining: No Wound Description Full Thickness Without Exposed Support Classification: Structures Wound Margin: Distinct, outline attached  Exudate None Present Amount: Foul Odor After Cleansing: No Slough/Fibrino No Wound Bed Granulation Amount: None Present (0%) Exposed Structure Necrotic Amount: None Present (0%) Fascia Exposed: No Fat Layer (Subcutaneous Tissue) Exposed: No Tendon Exposed: No Muscle Exposed: No Joint Exposed: No Bone Exposed: No Periwound Skin Texture Texture Color [name], [name]. ([id number]) No Abnormalities Noted: No No Abnormalities Noted: No Erythema: Yes Moisture Erythema Location: Circumferential No Abnormalities Noted: No Temperature / Pain Temperature: No Abnormality Tenderness on Palpation: Yes Wound Preparation Ulcer Cleansing: Rinsed/Irrigated with Saline Topical Anesthetic Applied: None Electronic Signature(s) Signed: 06/14/2017 4:57:50 PM By: Alejandro Mulling Entered By: Alejandro Mulling on 06/14/2017 13:39:19 [name] ([id number]) -------------------------------------------------------------------------------- Vitals Details Patient Name: [name]. Date of Service: 06/14/2017 1:30 PM Medical Record Number: [id number] Patient Account Number: [id number] Date of Birth/Sex: 08/30/1940 (77 y.o. Male) Treating RN: Phillis Haggis Primary Care Fotios Amos: Einar Crow Other Clinician: Referring Ronaldo Crilly: Einar Crow Treating Justino Boze/Extender: Linwood Dibbles, HOYT Weeks in Treatment: 1 Vital Signs Time Taken: 13:35 Temperature (F): 98.2 Height (in): 69 Pulse (bpm): 60 Weight (lbs): 219.1 Respiratory Rate (breaths/min): 18 Body Mass Index (BMI): 32.4 Blood Pressure (mmHg): 129/66 Reference Range: 80 - 120 mg / dl Electronic Signature(s) Signed: 06/14/2017 4:57:50 PM By: Alejandro Mulling Entered By: Alejandro Mulling on 06/14/2017 13:37:33

## 2017-12-15 ENCOUNTER — Other Ambulatory Visit: Payer: Self-pay

## 2017-12-15 ENCOUNTER — Encounter: Payer: Self-pay | Admitting: Anesthesiology

## 2017-12-15 ENCOUNTER — Ambulatory Visit: Payer: Medicare Other | Attending: Anesthesiology | Admitting: Anesthesiology

## 2017-12-15 VITALS — BP 130/83 | HR 75 | Temp 98.5°F | Resp 18 | Ht 69.0 in | Wt 206.0 lb

## 2017-12-15 DIAGNOSIS — I10 Essential (primary) hypertension: Secondary | ICD-10-CM | POA: Insufficient documentation

## 2017-12-15 DIAGNOSIS — M545 Low back pain: Secondary | ICD-10-CM | POA: Insufficient documentation

## 2017-12-15 DIAGNOSIS — Z87891 Personal history of nicotine dependence: Secondary | ICD-10-CM | POA: Insufficient documentation

## 2017-12-15 DIAGNOSIS — R7303 Prediabetes: Secondary | ICD-10-CM | POA: Diagnosis not present

## 2017-12-15 DIAGNOSIS — Z7982 Long term (current) use of aspirin: Secondary | ICD-10-CM | POA: Insufficient documentation

## 2017-12-15 DIAGNOSIS — M47816 Spondylosis without myelopathy or radiculopathy, lumbar region: Secondary | ICD-10-CM

## 2017-12-15 DIAGNOSIS — M5136 Other intervertebral disc degeneration, lumbar region: Secondary | ICD-10-CM | POA: Insufficient documentation

## 2017-12-15 DIAGNOSIS — I4510 Unspecified right bundle-branch block: Secondary | ICD-10-CM | POA: Diagnosis not present

## 2017-12-15 DIAGNOSIS — E785 Hyperlipidemia, unspecified: Secondary | ICD-10-CM | POA: Diagnosis not present

## 2017-12-15 DIAGNOSIS — Z79899 Other long term (current) drug therapy: Secondary | ICD-10-CM | POA: Diagnosis not present

## 2017-12-15 NOTE — Progress Notes (Signed)
Safety precautions to be maintained throughout the outpatient stay will include: orient to surroundings, keep bed in low position, maintain call bell within reach at all times, provide assistance with transfer out of bed and ambulation.  

## 2017-12-15 NOTE — Progress Notes (Signed)
Subjective:  Patient ID: [name], male    DOB: Nov 19, 1940  Age: 77 y.o. MRN: [id number]  CC: Back Pain (low)     PROCEDURE: None  HPI [name] presents for new patient evaluation.  He is a pleasant 77 year old white male with a recent history of new onset low back pain beginning in June 28 of this year.  Is mainly centralized low back pain of unknown origin that has gotten somewhat better since the original inciting event.  At that time he had severe centralized low back pain that was incapacitating rated at a pain score of 8 but now it is down to 1.  It is not influenced by time of day and does seem to be worse after immobility.  Aggravating factors include bending lifting and alleviating factors include stretching and hot packs.  He has had no previous MRI of his low back or previous injections.  He has had previous x-rays of his hips showing some degenerative arthritis.  The quality of the low back pain is mainly an aching nagging pain without associated motor weakness or sensory deficits to the lower extremities.  He has not had any problems with bowel or bladder function.  He cannot take anti-inflammatories secondary to his kidney function and worsening of his blood pressure problems.  He has also been through physical therapy with no significant improvement.  History [name] has a past medical history of Arthritis, Barrett's esophagus, BPH (benign prostatic hyperplasia), BPPV (benign paroxysmal positional vertigo), HBP (high blood pressure), Hyperlipidemia, Prediabetes, RBBB (right bundle branch block with left anterior fascicular block), Seborrheic keratosis, and Traumatic leg injury.   He has a past surgical history that includes Shoulder surgery (Bilateral) and Esophagogastroduodenoscopy (egd) with propofol (N/A, 03/27/2015).   His family history is not on file.He reports that he has quit smoking. He has never used smokeless tobacco. He reports that he drinks alcohol. He  reports that he does not use drugs.  No procedure found.  No results found for: TOXASSSELUR  Outpatient Medications Prior to Visit  Medication Sig Dispense Refill  . amLODipine (NORVASC) 5 MG tablet Take 2.5 mg by mouth daily.     Marland Kitchen aspirin 81 MG tablet Take 81 mg by mouth daily.    Marland Kitchen atorvastatin (LIPITOR) 40 MG tablet Take 40 mg by mouth daily.    . Cholecalciferol 2000 UNITS CAPS Take by mouth.    . diphenhydrAMINE (BENADRYL) 25 MG tablet Take 25 mg by mouth every 6 (six) hours as needed.    . finasteride (PROSCAR) 5 MG tablet Take 5 mg by mouth daily.    . folic acid (FOLVITE) 1 MG tablet Take 1 mg by mouth daily.    Marland Kitchen LACTOBACILLUS RHAMNOSUS, GG, PO Take by mouth.    . losartan-hydrochlorothiazide (HYZAAR) 100-12.5 MG per tablet Take 1 tablet by mouth daily.    . methotrexate (RHEUMATREX) 2.5 MG tablet Take 7.5 mg by mouth 3 (three) times a week.    Marland Kitchen omeprazole (PRILOSEC) 40 MG capsule Take 40 mg by mouth daily.    . tamsulosin (FLOMAX) 0.4 MG CAPS capsule Take 0.4 mg by mouth.    . traZODone (DESYREL) 50 MG tablet Take 50 mg by mouth at bedtime.    Marland Kitchen co-enzyme Q-10 30 MG capsule Take 200 mg by mouth daily.    Marland Kitchen gabapentin (NEURONTIN) 300 MG capsule Take by mouth.    . losartan-hydrochlorothiazide (HYZAAR) 100-25 MG tablet Take 1 tablet by mouth daily.  11  .  methotrexate (RHEUMATREX) 2.5 MG tablet Take by mouth.     No facility-administered medications prior to visit.    No results found for: WBC, HGB, HCT, PLT, GLUCOSE, CHOL, TRIG, HDL, LDLDIRECT, LDLCALC, ALT, AST, NA, K, CL, CREATININE, BUN, CO2, TSH, PSA, INR, GLUF, HGBA1C, MICROALBUR  --------------------------------------------------------------------------------------------------------------------- US Abdomen Limited Ruq  Result Date: 03/31/2016 CLINICAL DATA:  Elevated total bilirubin. EXAM: US ABDOMEN LIMITED - RIGHT UPPER QUADRANT COMPARISON:  Unenhanced CT abdomen/ pelvis from 02/15/2013. FINDINGS: Gallbladder:  Nondistended gallbladder. There is a solitary 5 x 3 x 4 mm polyp in the lower body of the gallbladder. No shadowing gallstones. No gallbladder wall thickening. No pericholecystic fluid. No sonographic Murphy sign. Common bile duct: Diameter: 3 mm Liver: Liver parenchyma is diffusely moderately to markedly echogenic with prominent posterior acoustic attenuation, consistent with moderate to severe diffuse hepatic steatosis. Simple lobulated 1.5 x 0.9 x 1.6 cm posterior left liver lobe cyst. Coarse linear 14 x 6 x 2 mm parenchymal calcification in the right liver lobe, unchanged, from remote insult. No additional liver lesions, noting decreased sensitivity in the setting of an echogenic liver. No definite liver surface irregularity demonstrated. No perihepatic ascites. IMPRESSION: 1. Solitary 5 mm gallbladder polyp, most consistent with a benign cholesterol polyp, for which no further follow-up is required. This recommendation follows ACR consensus guidelines: White Paper of the ACR Incidental Findings Committee II on Gallbladder and Biliary Findings. J Am Coll Radiol 2013:;10:953-956. 2. Otherwise normal gallbladder, with no cholelithiasis . 3. No biliary ductal dilatation . 4. Moderate to severe diffuse hepatic steatosis. Electronically Signed   By: Delbert Phenix M.D.   On: 03/31/2016 10:19       ---------------------------------------------------------------------------------------------------------------------- Past Medical History:  Diagnosis Date  . Arthritis   . Barrett's esophagus   . BPH (benign prostatic hyperplasia)   . BPPV (benign paroxysmal positional vertigo)   . HBP (high blood pressure)   . Hyperlipidemia   . Prediabetes   . RBBB (right bundle branch block with left anterior fascicular block)   . Seborrheic keratosis   . Traumatic leg injury     Past Surgical History:  Procedure Laterality Date  . ESOPHAGOGASTRODUODENOSCOPY (EGD) WITH PROPOFOL N/A 03/27/2015   Procedure:  ESOPHAGOGASTRODUODENOSCOPY (EGD) WITH PROPOFOL;  Surgeon: Wallace Cullens, MD;  Location: [facility] ENDOSCOPY;  Service: Gastroenterology;  Laterality: N/A;  . SHOULDER SURGERY Bilateral     History reviewed. No pertinent family history.  Social History   Tobacco Use  . Smoking status: Former Games developer  . Smokeless tobacco: Never Used  . Tobacco comment: quit 30years   Substance Use Topics  . Alcohol use: Yes    Comment: social    ---------------------------------------------------------------------------------------------------------------------  Scheduled Meds: Continuous Infusions: PRN Meds:.   BP 130/83   Pulse 75   Temp 98.5 F (36.9 C)   Resp 18   Ht 5\' 9"  (1.753 m)   Wt 206 lb (93.4 kg)   SpO2 97%   BMI 30.42 kg/m    BP Readings from Last 3 Encounters:  12/15/17 130/83  01/18/17 130/88  03/27/15 (!) 142/87     Wt Readings from Last 3 Encounters:  12/15/17 206 lb (93.4 kg)  03/27/15 218 lb (98.9 kg)  09/18/13 218 lb (98.9 kg)     ----------------------------------------------------------------------------------------------------------------------  ROS Review of Systems Cardiac: No angina or palpitations Pulmonary: No shortness of breath or wheezing GI: No abdominal pain or diarrhea constipation Neurologic: No lower extremity weakness or sensory loss.  Objective:  BP 130/83   Pulse  75   Temp 98.5 F (36.9 C)   Resp 18   Ht 5\' 9"  (1.753 m)   Wt 206 lb (93.4 kg)   SpO2 97%   BMI 30.42 kg/m   Physical Exam Patient is alert oriented cooperative compliant Heart is regular rate and rhythm without murmur Pupils are equally round reactive to light Low back reveals some paraspinous muscle lumbar nodules but no trigger points or pain with palpation in the lumbar region.  He has no significant reproduction of pain while standing and extension at the low back.  Left and right lateral rotation do not exacerbate his low back pain.  In the supine position he has a  negative straight leg raise bilaterally.  His lower extremity muscle tone and bulk is good without any evidence of lower extremity weakness or sensory loss.  He has a negative figure 4 sign to the lower extremities with some bilateral hip pain but no worsening of his low back pain.     Assessment & Plan:   Omareon was seen today for back pain.  Diagnoses and all orders for this visit:  DDD (degenerative disc disease), lumbar  Discogenic low back pain  Facet arthritis of lumbar region     ----------------------------------------------------------------------------------------------------------------------  Problem List Items Addressed This Visit    None    Visit Diagnoses    DDD (degenerative disc disease), lumbar    -  Primary   Discogenic low back pain       Facet arthritis of lumbar region          ----------------------------------------------------------------------------------------------------------------------  1. DDD (degenerative disc disease), lumbar Feel the majority of his low back pain is probably an exacerbation of some degenerative disc disease.  It appears that much of this is improving at this time and I do not feel that further studies or interventional therapy is appropriate this time.  As discussed with him today we will have him return to clinic at the next available date should he have worsening of his pain recurrence or it fails to improve over the course of the next month.  At that point we may consider an MRI.  2. Discogenic low back pain As above.  3. Facet arthritis of lumbar region He is currently doing some core stretching strengthening exercises that we reviewed today and I think these are appropriate.  He is scheduled for a as needed return to clinic.    ----------------------------------------------------------------------------------------------------------------------  I am having [name] maintain his  losartan-hydrochlorothiazide, atorvastatin, tamsulosin, co-enzyme Q-10, (LACTOBACILLUS RHAMNOSUS, GG, PO), aspirin, Cholecalciferol, diphenhydrAMINE, finasteride, folic acid, methotrexate, omeprazole, traZODone, amLODipine, gabapentin, losartan-hydrochlorothiazide, and methotrexate.   No orders of the defined types were placed in this encounter.      Follow-up: Return for procedure.    Yevette Edwards, MD 2:19 PM  The [place] practitioner database for opioid medications on this patient has been reviewed by me and my staff   Greater than 50% of the total encounter time was spent in counseling and / or coordination of care.     This dictation was performed utilizing Conservation officer, historic buildings.  Please excuse any unintentional or mistaken typographical errors as a result.

## 2018-01-09 ENCOUNTER — Ambulatory Visit: Payer: Medicare Other | Attending: Anesthesiology | Admitting: Anesthesiology

## 2018-01-09 ENCOUNTER — Encounter: Payer: Self-pay | Admitting: Anesthesiology

## 2018-01-09 VITALS — BP 149/87 | HR 62 | Temp 98.0°F | Resp 18 | Ht 69.0 in | Wt 212.0 lb

## 2018-01-09 DIAGNOSIS — M79605 Pain in left leg: Secondary | ICD-10-CM | POA: Insufficient documentation

## 2018-01-09 DIAGNOSIS — M25552 Pain in left hip: Secondary | ICD-10-CM | POA: Insufficient documentation

## 2018-01-09 DIAGNOSIS — M545 Low back pain: Secondary | ICD-10-CM | POA: Diagnosis not present

## 2018-01-09 DIAGNOSIS — M47816 Spondylosis without myelopathy or radiculopathy, lumbar region: Secondary | ICD-10-CM | POA: Diagnosis not present

## 2018-01-09 DIAGNOSIS — Z79899 Other long term (current) drug therapy: Secondary | ICD-10-CM | POA: Diagnosis not present

## 2018-01-09 DIAGNOSIS — M79604 Pain in right leg: Secondary | ICD-10-CM | POA: Insufficient documentation

## 2018-01-09 DIAGNOSIS — M5136 Other intervertebral disc degeneration, lumbar region: Secondary | ICD-10-CM | POA: Diagnosis not present

## 2018-01-09 DIAGNOSIS — M4686 Other specified inflammatory spondylopathies, lumbar region: Secondary | ICD-10-CM | POA: Diagnosis not present

## 2018-01-09 NOTE — Progress Notes (Signed)
Safety precautions to be maintained throughout the outpatient stay will include: orient to surroundings, keep bed in low position, maintain call bell within reach at all times, provide assistance with transfer out of bed and ambulation.  

## 2018-01-11 ENCOUNTER — Encounter: Payer: Self-pay | Admitting: Student in an Organized Health Care Education/Training Program

## 2018-01-12 ENCOUNTER — Telehealth: Payer: Self-pay

## 2018-01-12 NOTE — Telephone Encounter (Signed)
Spoke with patient and he states that Dr Cherylann Ratel had mentioned that there may be  a procedure that was less invasive if he was unable to stop the blood thinners.  [place]ill address with Dr Cherylann Ratel and notify patient.

## 2018-01-12 NOTE — Telephone Encounter (Signed)
Spoke with Dr Cherylann Ratel.  States the other procedure was a SIJI but it would need blood thinners stopped as well.  Patient notified.   Informed [name] to cancel procedure.

## 2018-01-16 ENCOUNTER — Ambulatory Visit: Payer: Medicare Other | Admitting: Student in an Organized Health Care Education/Training Program

## 2018-01-17 ENCOUNTER — Encounter: Payer: Self-pay | Admitting: Anesthesiology

## 2018-01-17 NOTE — Progress Notes (Signed)
Subjective:  Patient ID: [name], male    DOB: 26-Oct-1940  Age: 77 y.o. MRN: [id number]  CC: No chief complaint on file.   Procedure: None  HPI [name] presents for evaluation.  He continues to have severe low back pain with radiation in the lower extremities.  The quality characteristic and distribution of the pain has been stable in nature without significant change noted.  Unfortunately he is on Eliquis at this time and we will request to see if we can continue this prior to an injection therapy.  We will plan on bring him back in approximately 1 or 2 weeks for an epidural injection no change in lower extremity strength or function or bowel bladder function are noted at this moment.  Outpatient Medications Prior to Visit  Medication Sig Dispense Refill  . atorvastatin (LIPITOR) 40 MG tablet Take 40 mg by mouth daily.    . Cholecalciferol (VITAMIN D3) 2000 units capsule Take 2,000 Units by mouth daily.    Marland Kitchen diltiazem (CARDIZEM CD) 180 MG 24 hr capsule Take 180 mg by mouth daily.    Marland Kitchen ELIQUIS 5 MG TABS tablet Take 5 mg by mouth every 12 (twelve) hours.  0  . finasteride (PROSCAR) 5 MG tablet Take 5 mg by mouth daily.    . folic acid (FOLVITE) 400 MCG tablet Take 800 mcg by mouth daily.    Marland Kitchen gabapentin (NEURONTIN) 300 MG capsule Take 300 mg by mouth as needed.    Marland Kitchen losartan (COZAAR) 100 MG tablet Take 100 mg by mouth daily.  3  . methotrexate (RHEUMATREX) 2.5 MG tablet Take 7.5 mg by mouth 3 (three) times a week.    Marland Kitchen omeprazole (PRILOSEC) 40 MG capsule Take 40 mg by mouth daily.    . tamsulosin (FLOMAX) 0.4 MG CAPS capsule Take 0.4 mg by mouth.    Marland Kitchen amLODipine (NORVASC) 5 MG tablet Take 2.5 mg by mouth daily.     Marland Kitchen aspirin 81 MG tablet Take 81 mg by mouth daily.    Marland Kitchen atorvastatin (LIPITOR) 40 MG tablet Take 40 mg by mouth daily.    . Cholecalciferol 2000 UNITS CAPS Take by mouth.    . co-enzyme Q-10 30 MG capsule Take 200 mg by mouth daily.    . diphenhydrAMINE  (BENADRYL) 25 MG tablet Take 25 mg by mouth every 6 (six) hours as needed.    . finasteride (PROSCAR) 5 MG tablet Take 5 mg by mouth daily.    . folic acid (FOLVITE) 1 MG tablet Take 1 mg by mouth daily.    Marland Kitchen gabapentin (NEURONTIN) 300 MG capsule Take by mouth.    Marland Kitchen LACTOBACILLUS RHAMNOSUS, GG, PO Take by mouth.    . losartan-hydrochlorothiazide (HYZAAR) 100-12.5 MG per tablet Take 1 tablet by mouth daily.    Marland Kitchen losartan-hydrochlorothiazide (HYZAAR) 100-25 MG tablet Take 1 tablet by mouth daily.  11  . methotrexate (RHEUMATREX) 2.5 MG tablet Take by mouth.    . Methotrexate, Anti-Rheumatic, (RHEUMATREX PO) Take 2.5 mg by mouth daily.    . tamsulosin (FLOMAX) 0.4 MG CAPS capsule Take 0.4 mg by mouth daily.    . traZODone (DESYREL) 50 MG tablet Take 50 mg by mouth at bedtime.     No facility-administered medications prior to visit.     Review of Systems CNS: No confusion or sedation Cardiac: No angina or palpitations GI: No abdominal pain or constipation Constitutional: No nausea vomiting fevers or chills  Objective:  BP (!) 149/87  Pulse 62   Temp 98 F (36.7 C)   Resp 18   Ht 5\' 9"  (1.753 m)   Wt 212 lb (96.2 kg)   SpO2 100%   BMI 31.31 kg/m    BP Readings from Last 3 Encounters:  01/09/18 (!) 149/87  12/15/17 130/83  01/18/17 130/88     Wt Readings from Last 3 Encounters:  01/09/18 212 lb (96.2 kg)  12/15/17 206 lb (93.4 kg)  03/27/15 218 lb (98.9 kg)     Physical Exam Pt is alert and oriented PERRL EOMI HEART IS RRR no murmur or rub LCTA no wheezing or rales MUSCULOSKELETAL reveals some paraspinous muscle tenderness of a persistent nature.  No change in his lower extremity strength or function are noted since her last evaluation.  His muscle tone and bulk are at baseline  Labs  No results found for: HGBA1C No results found for: GLUF, MICROALBUR, LDLCALC,  CREATININE  -------------------------------------------------------------------------------------------------------------------- No results found for: WBC, HGB, HCT, PLT, GLUCOSE, CHOL, TRIG, HDL, LDLDIRECT, LDLCALC, ALT, AST, NA, K, CL, CREATININE, BUN, CO2, TSH, PSA, INR, GLUF, HGBA1C, MICROALBUR  --------------------------------------------------------------------------------------------------------------------- 13/03/16 Abdomen Limited Ruq  Result Date: 03/31/2016 CLINICAL DATA:  Elevated total bilirubin. EXAM: 13/12/2015 ABDOMEN LIMITED - RIGHT UPPER QUADRANT COMPARISON:  Unenhanced CT abdomen/ pelvis from 02/15/2013. FINDINGS: Gallbladder: Nondistended gallbladder. There is a solitary 5 x 3 x 4 mm polyp in the lower body of the gallbladder. No shadowing gallstones. No gallbladder wall thickening. No pericholecystic fluid. No sonographic Murphy sign. Common bile duct: Diameter: 3 mm Liver: Liver parenchyma is diffusely moderately to markedly echogenic with prominent posterior acoustic attenuation, consistent with moderate to severe diffuse hepatic steatosis. Simple lobulated 1.5 x 0.9 x 1.6 cm posterior left liver lobe cyst. Coarse linear 14 x 6 x 2 mm parenchymal calcification in the right liver lobe, unchanged, from remote insult. No additional liver lesions, noting decreased sensitivity in the setting of an echogenic liver. No definite liver surface irregularity demonstrated. No perihepatic ascites. IMPRESSION: 1. Solitary 5 mm gallbladder polyp, most consistent with a benign cholesterol polyp, for which no further follow-up is required. This recommendation follows ACR consensus guidelines: White Paper of the ACR Incidental Findings Committee II on Gallbladder and Biliary Findings. J Am Coll Radiol 2013:;10:953-956. 2. Otherwise normal gallbladder, with no cholelithiasis . 3. No biliary ductal dilatation . 4. Moderate to severe diffuse hepatic steatosis. Electronically Signed   By: 02/17/2013 M.D.   On:  03/31/2016 10:19     Assessment & Plan:   Diagnoses and all orders for this visit:  DDD (degenerative disc disease), lumbar  Discogenic low back pain  Facet arthritis of lumbar region  Pain of left hip joint        ----------------------------------------------------------------------------------------------------------------------  Problem List Items Addressed This Visit    None    Visit Diagnoses    DDD (degenerative disc disease), lumbar    -  Primary   Relevant Medications   Methotrexate, Anti-Rheumatic, (RHEUMATREX PO)   Discogenic low back pain       Relevant Medications   Methotrexate, Anti-Rheumatic, (RHEUMATREX PO)   Facet arthritis of lumbar region       Relevant Medications   Methotrexate, Anti-Rheumatic, (RHEUMATREX PO)   Pain of left hip joint            ----------------------------------------------------------------------------------------------------------------------  1. DDD (degenerative disc disease), lumbar We will plan on doing an epidural at the next available date.  The risks and benefits of been reviewed with him in  full detail.  He is on Eliquis we will request a discontinuation from his cardiologist prior to proceeding with that injection.  2. Discogenic low back pain As above  3. Facet arthritis of lumbar region As above and continue core stretching strengthening exercises.  4. Pain of left hip joint As above    ----------------------------------------------------------------------------------------------------------------------  I am having [name] maintain his losartan-hydrochlorothiazide, atorvastatin, tamsulosin, co-enzyme Q-10, (LACTOBACILLUS RHAMNOSUS, GG, PO), aspirin, Cholecalciferol, diphenhydrAMINE, finasteride, folic acid, methotrexate, omeprazole, traZODone, amLODipine, gabapentin, losartan-hydrochlorothiazide, methotrexate, ELIQUIS, diltiazem, losartan, atorvastatin, Vitamin D3, finasteride, folic acid,  gabapentin, (Methotrexate, Anti-Rheumatic, (RHEUMATREX PO)), and tamsulosin.   No orders of the defined types were placed in this encounter.  Patient's Medications  New Prescriptions   No medications on file  Previous Medications   AMLODIPINE (NORVASC) 5 MG TABLET    Take 2.5 mg by mouth daily.    ASPIRIN 81 MG TABLET    Take 81 mg by mouth daily.   ATORVASTATIN (LIPITOR) 40 MG TABLET    Take 40 mg by mouth daily.   ATORVASTATIN (LIPITOR) 40 MG TABLET    Take 40 mg by mouth daily.   CHOLECALCIFEROL (VITAMIN D3) 2000 UNITS CAPSULE    Take 2,000 Units by mouth daily.   CHOLECALCIFEROL 2000 UNITS CAPS    Take by mouth.   CO-ENZYME Q-10 30 MG CAPSULE    Take 200 mg by mouth daily.   DILTIAZEM (CARDIZEM CD) 180 MG 24 HR CAPSULE    Take 180 mg by mouth daily.   DIPHENHYDRAMINE (BENADRYL) 25 MG TABLET    Take 25 mg by mouth every 6 (six) hours as needed.   ELIQUIS 5 MG TABS TABLET    Take 5 mg by mouth every 12 (twelve) hours.   FINASTERIDE (PROSCAR) 5 MG TABLET    Take 5 mg by mouth daily.   FINASTERIDE (PROSCAR) 5 MG TABLET    Take 5 mg by mouth daily.   FOLIC ACID (FOLVITE) 1 MG TABLET    Take 1 mg by mouth daily.   FOLIC ACID (FOLVITE) 400 MCG TABLET    Take 800 mcg by mouth daily.   GABAPENTIN (NEURONTIN) 300 MG CAPSULE    Take by mouth.   GABAPENTIN (NEURONTIN) 300 MG CAPSULE    Take 300 mg by mouth as needed.   LACTOBACILLUS RHAMNOSUS, GG, PO    Take by mouth.   LOSARTAN (COZAAR) 100 MG TABLET    Take 100 mg by mouth daily.   LOSARTAN-HYDROCHLOROTHIAZIDE (HYZAAR) 100-12.5 MG PER TABLET    Take 1 tablet by mouth daily.   LOSARTAN-HYDROCHLOROTHIAZIDE (HYZAAR) 100-25 MG TABLET    Take 1 tablet by mouth daily.   METHOTREXATE (RHEUMATREX) 2.5 MG TABLET    Take 7.5 mg by mouth 3 (three) times a week.   METHOTREXATE (RHEUMATREX) 2.5 MG TABLET    Take by mouth.   METHOTREXATE, ANTI-RHEUMATIC, (RHEUMATREX PO)    Take 2.5 mg by mouth daily.   OMEPRAZOLE (PRILOSEC) 40 MG CAPSULE    Take 40 mg by  mouth daily.   TAMSULOSIN (FLOMAX) 0.4 MG CAPS CAPSULE    Take 0.4 mg by mouth.   TAMSULOSIN (FLOMAX) 0.4 MG CAPS CAPSULE    Take 0.4 mg by mouth daily.   TRAZODONE (DESYREL) 50 MG TABLET    Take 50 mg by mouth at bedtime.  Modified Medications   No medications on file  Discontinued Medications   No medications on file   ----------------------------------------------------------------------------------------------------------------------  Follow-up: Return in about 1  week (around 01/16/2018) for procedure, evaluation.    Yevette Edwards, MD

## 2018-01-30 ENCOUNTER — Other Ambulatory Visit: Payer: Self-pay

## 2018-05-17 IMAGING — MR MR HIP*L* W/O CM
4 series · 22 of 40 positions shown · non-contrast
Comparison: None.

CLINICAL DATA: Left hip pain for 1 month.  No known injury.

EXAM:
MR OF THE LEFT HIP WITHOUT CONTRAST
TECHNIQUE: Multiplanar, multisequence MR imaging was performed. No intravenous
contrast was administered.

[Series 3: T1 · coronal · 4.0mm · 0.78mm/px · 3 of 30 slices shown (1 of 2)]
[im 5/30]
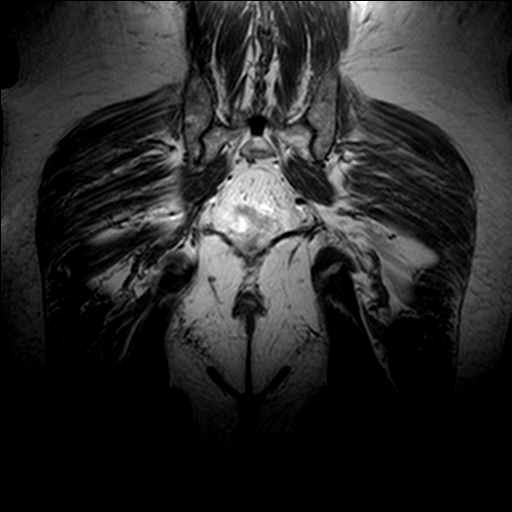
[im 17/30]
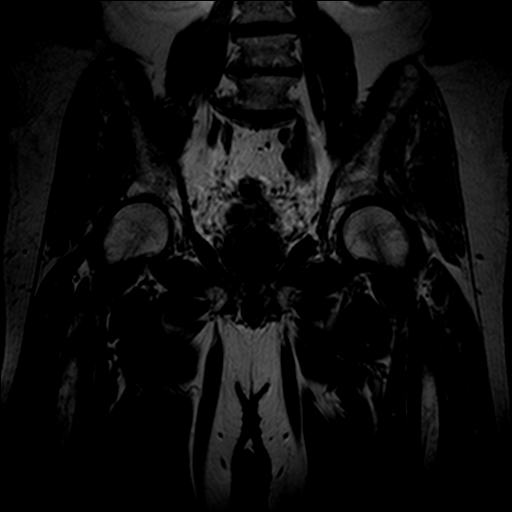
[im 25/30]
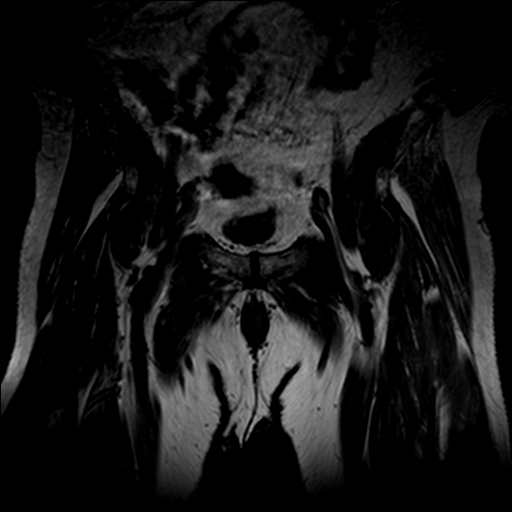

[Series 4: T1 · axial · 4.0mm · 0.78mm/px · z∈[-83,+77]mm · 3 of 40 slices shown (2 of 2)]
[im 4/40]
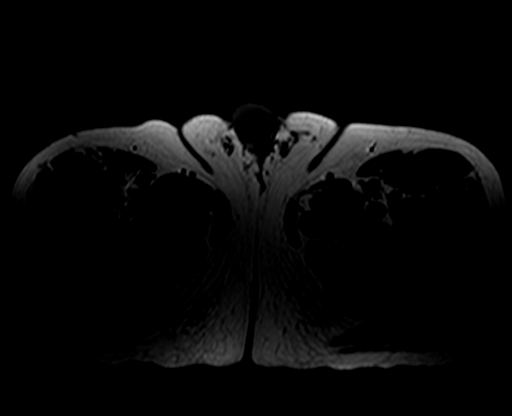
[im 20/40]
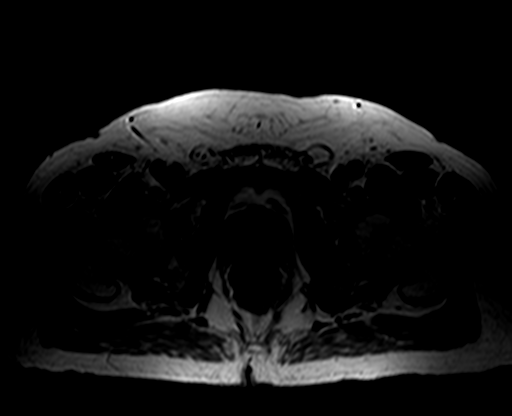
[im 36/40]
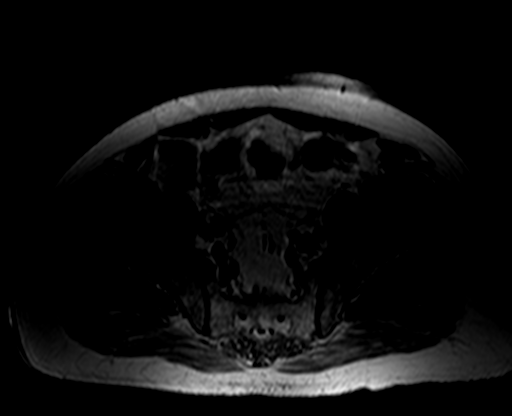

[Series 5: T2 fat-sat · axial · 4.0mm · 0.78mm/px · z∈[-99,+76]mm · 8 of 40 slices shown]
[im 1/40]
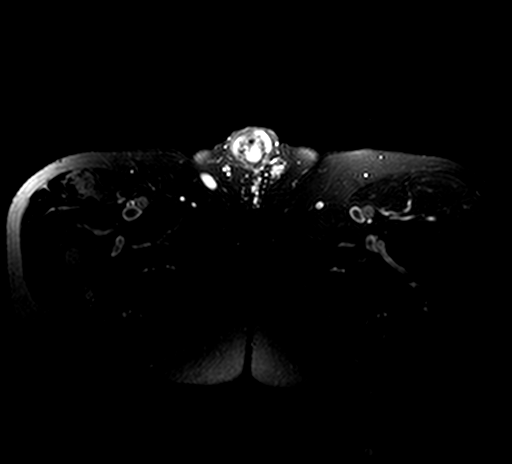
[im 4/40]
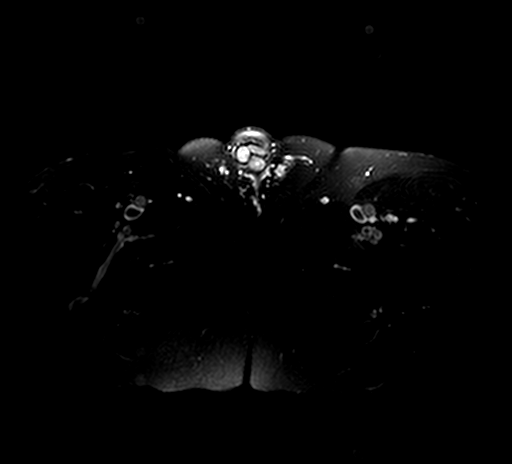
[im 8/40]
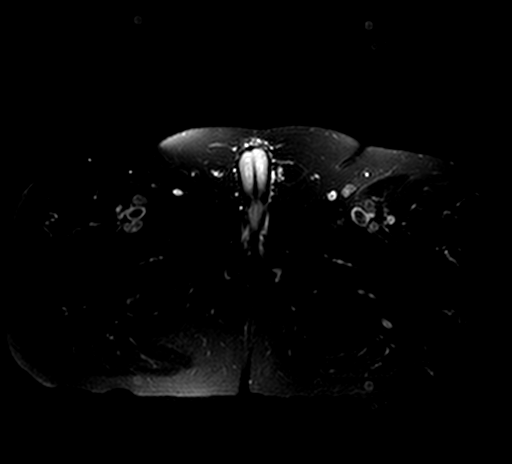
[im 12/40]
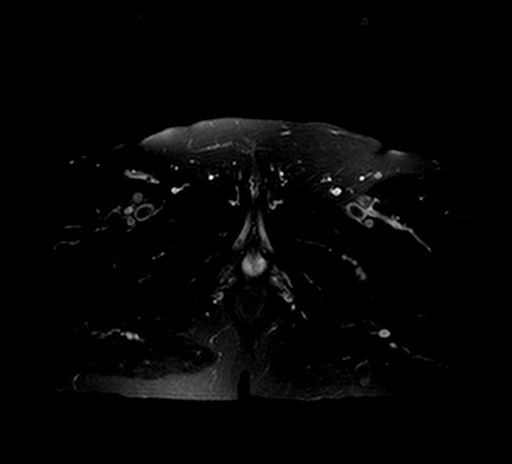
[im 16/40]
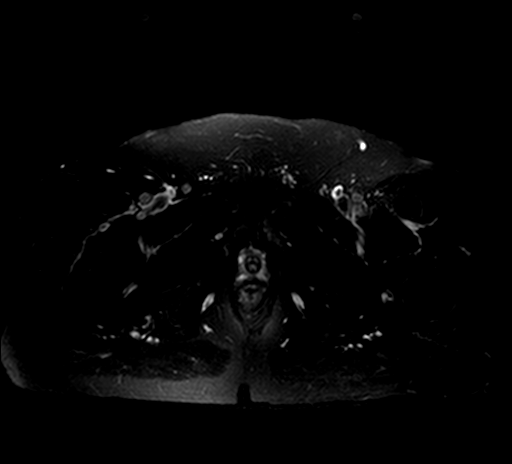
[im 20/40]
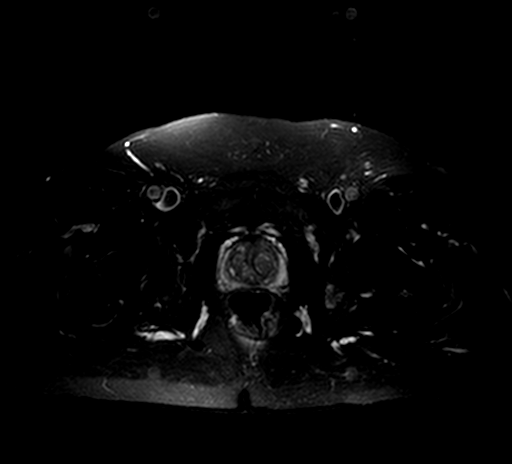
[im 24/40]
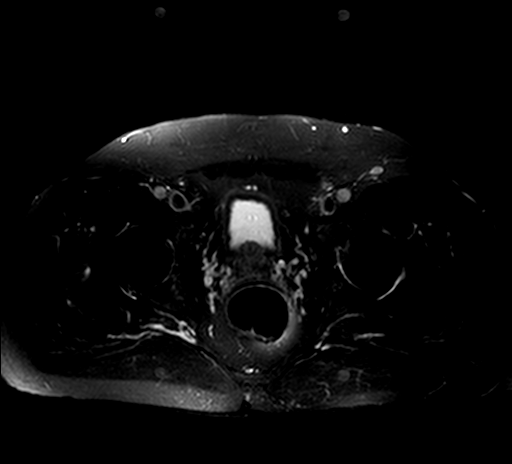
[im 36/40]
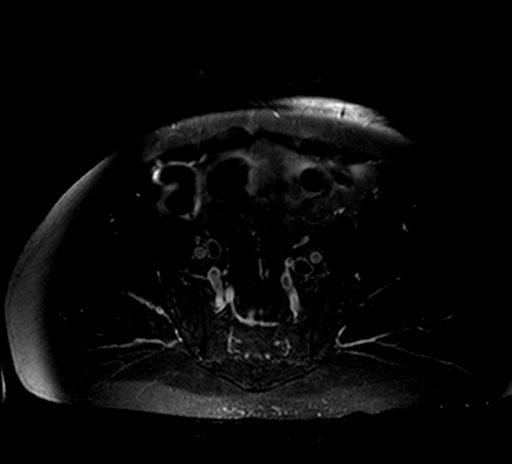

[Series 6: PD fat-sat · sagittal · 4.0mm · 0.35mm/px · 8 of 35 slices shown]
[im 1/35]
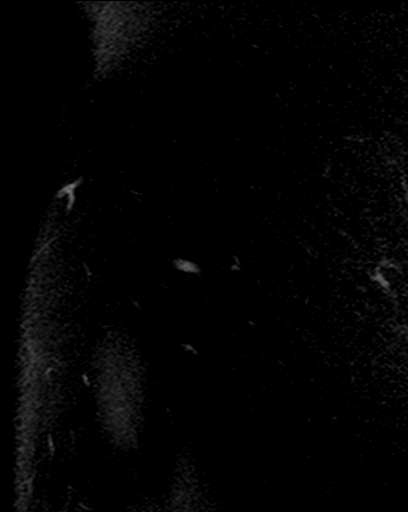
[im 4/35]
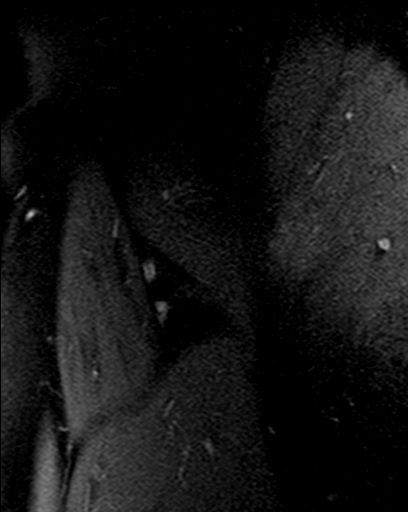
[im 12/35]
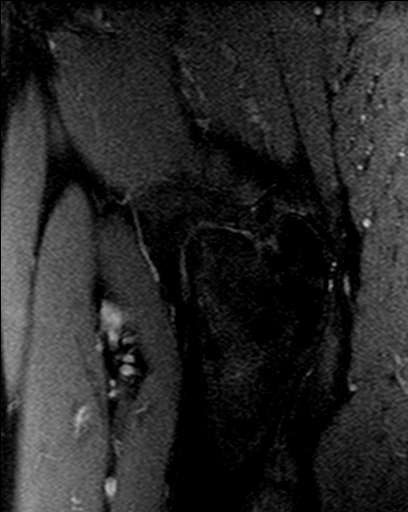
[im 16/35]
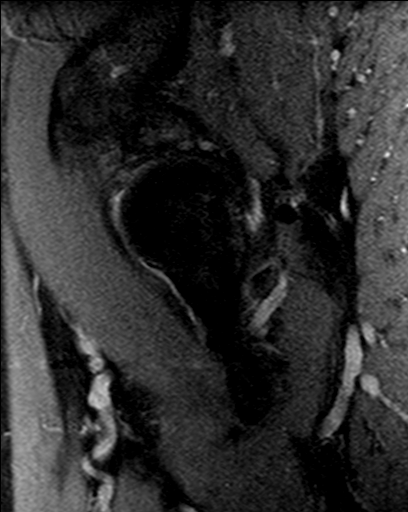
[im 19/35]
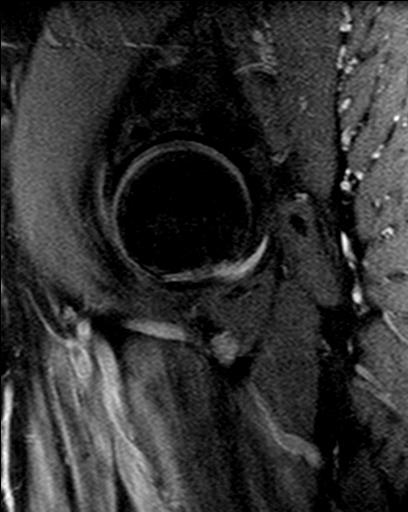
[im 23/35]
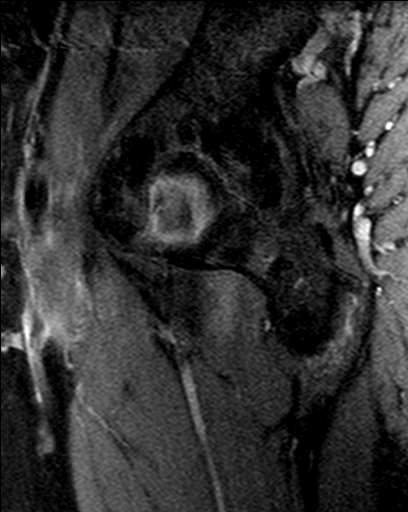
[im 31/35]
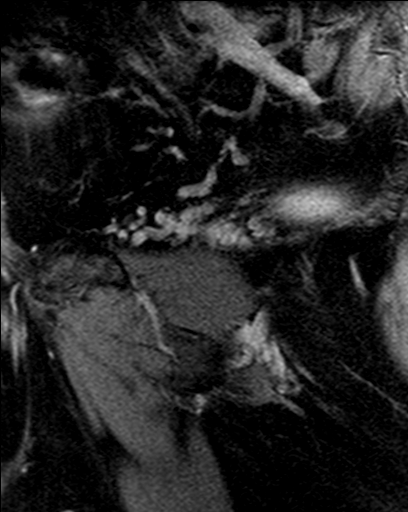
[im 35/35]
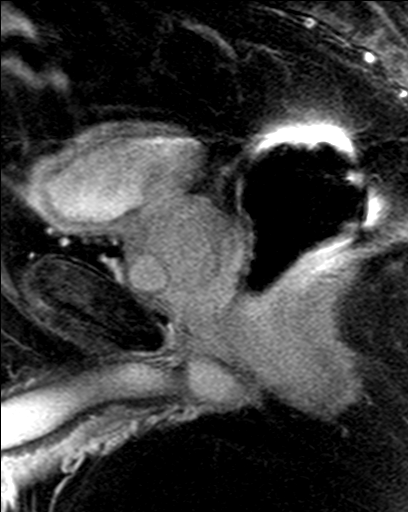

[22 of 40 positions shown; findings below may reference images not displayed]

FINDINGS: Bones: There is no acute bony or joint abnormality. No avascular
necrosis of the femoral heads is identified. There is some
subchondral edema in the periphery of the left acetabulum. Loss of
disc space height and endplate degenerative signal change are seen
at L4-5 and L5-S1.

Articular cartilage and labrum

Articular cartilage: Thinning is present bilaterally, worse on the
left where there is near bone-on-bone joint space narrowing.

Labrum: The anterior, superior left labrum is degenerated and torn.
Although less well seen, there is intrasubstance increased T2 signal
in the superior left labrum compatible with degenerative tear.

Joint or bursal effusion

Joint effusion:  None.

Bursae:  Unremarkable.

Muscles and tendons

Muscles and tendons:  Intact.

Other findings

Miscellaneous:  None.
IMPRESSION: Left worse than right hip osteoarthritis. Associated degenerative
tearing of the left acetabular labrum is identified.

Lower lumbar spondylosis is incompletely imaged.

## 2018-06-12 ENCOUNTER — Other Ambulatory Visit: Payer: Self-pay | Admitting: Internal Medicine

## 2018-06-12 DIAGNOSIS — R0789 Other chest pain: Secondary | ICD-10-CM

## 2018-06-16 ENCOUNTER — Ambulatory Visit
Admission: RE | Admit: 2018-06-16 | Discharge: 2018-06-16 | Disposition: A | Discharge status: Home / Self Care | Payer: Medicare Other | Source: Ambulatory Visit | Attending: Internal Medicine | Admitting: Internal Medicine

## 2018-06-16 DIAGNOSIS — R0789 Other chest pain: Secondary | ICD-10-CM

## 2018-09-26 ENCOUNTER — Ambulatory Visit (INDEPENDENT_AMBULATORY_CARE_PROVIDER_SITE_OTHER): Payer: Medicare Other

## 2018-09-26 ENCOUNTER — Encounter: Payer: Self-pay | Admitting: Orthopaedic Surgery

## 2018-09-26 ENCOUNTER — Other Ambulatory Visit: Payer: Self-pay

## 2018-09-26 ENCOUNTER — Ambulatory Visit (INDEPENDENT_AMBULATORY_CARE_PROVIDER_SITE_OTHER): Payer: Medicare Other | Admitting: Orthopaedic Surgery

## 2018-09-26 DIAGNOSIS — M25552 Pain in left hip: Secondary | ICD-10-CM

## 2018-09-26 MED ORDER — METHYLPREDNISOLONE ACETATE 40 MG/ML IJ SUSP: 40.0000 mg | Freq: Once | INTRAMUSCULAR | Status: DC

## 2018-09-26 NOTE — Progress Notes (Signed)
Subjective: Patient is here for ultrasound-guided intra-articular left hip injection.  Objective:  Very decreased ROM with passive IR.  Pain at extreme of IR, but especially painful with ER.  Procedure: Ultrasound-guided left hip injection: After sterile prep with Betadine, injected 8 cc 1% lidocaine without epinephrine and 40 mg methylprednisolone using a 22-gauge spinal needle, passing the needle through the iliofemoral ligament into the femoral head/neck junction.  Injectate was seen filling the joint capsule.  Follow up with Dr. Magnus Ivan as scheduled.

## 2018-09-26 NOTE — Progress Notes (Signed)
Office Visit Note   Patient: [name]           Date of Birth: September 17, 1940           MRN: [id number] Visit Date: 09/26/2018              Requested by: Lauro Regulus, MD [address] [facility] [place], [place] [address] PCP: Lauro Regulus, MD   Assessment & Plan: Visit Diagnoses:  1. Pain in left hip     Plan: At this point he is tried failed all forms of conservative treatment and is at the point he does wish to proceed with total hip replacement surgery.  We will have him stop Eliquis about 4 days before surgery.  I talked about the risk and benefits of surgery.  We went over his x-rays in detail.  I showed him a hip model and explained what his intraoperative and postoperative course would involve.  Given the coronavirus pandemic we are having to hold off on surgery for now.  I cannot give him a timetable of when we can perform his surgery.  I do feel a consultation in our office today is warranted with Dr. Prince Rome to perform an ultrasound-guided steroid injection in his left hip while we wait to have surgery scheduled in hopefully 4 to 6 weeks from now.  All question concerns were answered and addressed.  Follow-Up Instructions: Return for 2 weeks post-op.   Orders:  Orders Placed This Encounter  Procedures   XR HIP UNILAT W OR W/O PELVIS 2-3 VIEWS LEFT   No orders of the defined types were placed in this encounter.     Procedures: No procedures performed   Clinical Data: No additional findings.   Subjective: Chief Complaint  Patient presents with   Left Hip - Pain  The patient is a 78 year old gentleman well-known to me.  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  He has had an intra-articular steroid injection as well and that temporize his symptoms for a long period of time.  At this point his pain can be daily and it is 10 out  of 10 at times.  His left hip pain is in the groin.  It is worse with mobility and activities.  It does wake him up with pain at night on occasion.  At this point his left hip pain is detrimentally affect his mobility, his quality of life and his activities daily living to the point that he does wish to proceed with total hip arthroplasty surgery.  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  He has had A. fib in the past and he is on chronic Eliquis.  He can come off Eliquis for surgery without needing to be bridged with any additional blood thinning medication.  He follows up regularly with his cardiologist in electrophysiologist.  HPI  Review of Systems He currently denies any headache, chest pain, shortness of breath, fever, chills, nausea, vomiting  Objective: Vital Signs: There were no vitals taken for this visit.  Physical Exam He is alert and orient x3 and in no acute distress Ortho Exam Examination of his left hip shows significant limitations with internal and external rotation.  He cannot cross his leg and is difficult for him to put on his shoes and socks.  He has significant amount of pain  in the left hip especially groin with internal and external rotation. Specialty Comments:  No specialty comments available.  Imaging: Xr Hip Unilat W Or W/o Pelvis 2-3 Views Left  Result Date: 09/26/2018 An AP pelvis and lateral of the left hip show severe end-stage arthritis of the left hip.  There is flattening of the femoral head neck.  There is complete loss of the superior lateral joint space.  There are sclerotic changes in the acetabulum and the femoral head.  There is a large particular osteophyte around the acetabulum as well.  There is mild to moderate arthritic changes of the right hip.    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)  07/14/2016   Primary osteoarthritis of right wrist 06/17/2015   Trigger little finger of left hand 06/17/2015   Arthritis 05/15/2014   Carpal tunnel syndrome 06/03/2012   Neuromuscular disorder (HCC) 02/15/2012   Adhesive capsulitis of left shoulder 02/01/2012   Acromioclavicular joint arthritis 08/29/2011   Biceps tendinitis 08/29/2011   Complete rupture of left rotator cuff 08/29/2011   Hyperlipidemia, mixed 05/03/2011   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 No pertinent family history.  Past Surgical History:  Procedure Laterality Date   ESOPHAGOGASTRODUODENOSCOPY (EGD) WITH PROPOFOL N/A 03/27/2015   Procedure: ESOPHAGOGASTRODUODENOSCOPY (EGD) WITH PROPOFOL;  Surgeon: Wallace Cullens, MD;  Location: [facility] ENDOSCOPY;  Service: Gastroenterology;  Laterality: N/A;   SHOULDER SURGERY Bilateral    Social History   Occupational History   Not on file  Tobacco Use   Smoking status: Former Smoker   Smokeless tobacco: Never Used   Tobacco comment: quit 30years   Substance and Sexual Activity   Alcohol use: Yes    Comment: social   Drug use: No   Sexual activity: Not on file

## 2018-10-18 ENCOUNTER — Other Ambulatory Visit: Payer: Self-pay | Admitting: Physician Assistant

## 2018-10-18 NOTE — Progress Notes (Signed)
Please place orders in Epic as patient is being scheduled for a pre-op appointment! Thank you! 

## 2018-10-20 NOTE — Progress Notes (Addendum)
03-24-18  EKG  on chart  06-30-18 (Chart Everywhere) LOV w/cardilogy

## 2018-10-20 NOTE — Patient Instructions (Addendum)
[name]  10/20/2018   Your procedure is scheduled on: 10-27-18    Report to [facility] Main  Entrance    Report to Admitting at 7:15 AM   YOU NEED TO HAVE A COVID 19 TEST ON 10/24/18 at 3:00 pm.  THIS TEST MUST BE DONE BEFORE SURGERY, COME TO [facility] ENTRANCE BETWEEN THE HOURS OF 900 AM AND 300 PM ON YOUR COVID TEST DATE. ONCE YOU HAVE COMPLETED YOUR COVID 19 TEST, PLEASE BEGIN THE QUARANTINE PROCESS AS OUTLINED IN YOUR HANDOUT.   Call this number if you have problems the morning of surgery [phone]    Remember: NO SOLID FOOD AFTER MIDNIGHT THE NIGHT PRIOR TO SURGERY. NOTHING BY MOUTH EXCEPT CLEAR LIQUIDS UNTIL 3 HOURS PRIOR TO SCHEULED SURGERY. PLEASE FINISH ENSURE DRINK PER SURGEON ORDER 3 HOURS PRIOR TO SCHEDULED SURGERY TIME WHICH NEEDS TO BE COMPLETED AT 4:30 AM.   CLEAR LIQUID DIET   Foods Allowed                                                                     Foods Excluded  Coffee and tea, regular and decaf                             liquids that you cannot  Plain Jell-O in any flavor                                             see through such as: Fruit ices (not with fruit pulp)                                     milk, soups, orange juice  Iced Popsicles                                    All solid food Carbonated beverages, regular and diet                                    Cranberry, grape and apple juices Sports drinks like Gatorade Lightly seasoned clear broth or consume(fat free) Sugar, honey syrup  Sample Menu Breakfast                                Lunch                                     Supper Cranberry juice                    Beef broth  Chicken broth Jell-O                                     Grape juice                           Apple juice Coffee or tea                        Jell-O                                      Popsicle                   Coffee or tea                        Coffee or tea  _____________________________________________________________________      BRUSH YOUR TEETH MORNING OF SURGERY AND RINSE YOUR MOUTH OUT, NO CHEWING GUM CANDY OR MINTS.     Take these medicines the morning of surgery with A SIP OF WATER: OMEPRAZOLE (PRILOSEC)                                You may not have any metal on your body including hair pins and              piercings     Do not wear jewelry, cologne, lotions, powders or deodorant                   Men may shave face and neck.   Do not bring valuables to the hospital. [facility] IS NOT             RESPONSIBLE   FOR VALUABLES.  Contacts, dentures or bridgework may not be worn into surgery.    Special Instructions: N/A              Please read over the following fact sheets you were given: _____________________________________________________________________             [facility] - Preparing for Surgery Before surgery, you can play an important role.  Because skin is not sterile, your skin needs to be as free of germs as possible.  You can reduce the number of germs on your skin by washing with CHG (chlorahexidine gluconate) soap before surgery.  CHG is an antiseptic cleaner which kills germs and bonds with the skin to continue killing germs even after washing. Please DO NOT use if you have an allergy to CHG or antibacterial soaps.  If your skin becomes reddened/irritated stop using the CHG and inform your nurse when you arrive at Short Stay. Do not shave (including legs and underarms) for at least 48 hours prior to the first CHG shower.  You may shave your face/neck. Please follow these instructions carefully:  1.  Shower with CHG Soap the night before surgery and the  morning of Surgery.  2.  If you choose to wash your hair, wash your hair first as usual with your  normal  shampoo.  3.  After you shampoo, rinse your hair and body thoroughly to remove  the  shampoo.  4.  Use CHG as you would any other liquid soap.  You can apply chg directly  to the skin and wash                       Gently with a scrungie or clean washcloth.  5.  Apply the CHG Soap to your body ONLY FROM THE NECK DOWN.   Do not use on face/ open                           Wound or open sores. Avoid contact with eyes, ears mouth and genitals (private parts).                       Wash face,  Genitals (private parts) with your normal soap.             6.  Wash thoroughly, paying special attention to the area where your surgery  will be performed.  7.  Thoroughly rinse your body with warm water from the neck down.  8.  DO NOT shower/wash with your normal soap after using and rinsing off  the CHG Soap.                9.  Pat yourself dry with a clean towel.            10.  Wear clean pajamas.            11.  Place clean sheets on your bed the night of your first shower and do not  sleep with pets. Day of Surgery : Do not apply any lotions/deodorants the morning of surgery.  Please wear clean clothes to the hospital/surgery center.  FAILURE TO FOLLOW THESE INSTRUCTIONS MAY RESULT IN THE CANCELLATION OF YOUR SURGERY PATIENT SIGNATURE_________________________________  NURSE SIGNATURE__________________________________  ________________________________________________________________________   [name]  An incentive spirometer is a tool that can help keep your lungs clear and active. This tool measures how well you are filling your lungs with each breath. Taking long deep breaths may help reverse or decrease the chance of developing breathing (pulmonary) problems (especially infection) following:  A long period of time when you are unable to move or be active. BEFORE THE PROCEDURE   If the spirometer includes an indicator to show your best effort, your nurse or respiratory therapist will set it to a desired goal.  If possible, sit up  straight or lean slightly forward. Try not to slouch.  Hold the incentive spirometer in an upright position. INSTRUCTIONS FOR USE  1. Sit on the edge of your bed if possible, or sit up as far as you can in bed or on a chair. 2. Hold the incentive spirometer in an upright position. 3. Breathe out normally. 4. Place the mouthpiece in your mouth and seal your lips tightly around it. 5. Breathe in slowly and as deeply as possible, raising the piston or the ball toward the top of the column. 6. Hold your breath for 3-5 seconds or for as long as possible. Allow the piston or ball to fall to the bottom of the column. 7. Remove the mouthpiece from your mouth and breathe out normally. 8. Rest for a few seconds and repeat Steps 1 through 7 at least 10 times every 1-2 hours when you are awake. Take your time and take a few normal breaths between deep breaths. 9. The spirometer may include an indicator to show  your best effort. Use the indicator as a goal to work toward during each repetition. 10. After each set of 10 deep breaths, practice coughing to be sure your lungs are clear. If you have an incision (the cut made at the time of surgery), support your incision when coughing by placing a pillow or rolled up towels firmly against it. Once you are able to get out of bed, walk around indoors and cough well. You may stop using the incentive spirometer when instructed by your caregiver.  RISKS AND COMPLICATIONS  Take your time so you do not get dizzy or light-headed.  If you are in pain, you may need to take or ask for pain medication before doing incentive spirometry. It is harder to take a deep breath if you are having pain. AFTER USE  Rest and breathe slowly and easily.  It can be helpful to keep track of a log of your progress. Your caregiver can provide you with a simple table to help with this. If you are using the spirometer at home, follow these instructions: SEEK MEDICAL CARE IF:   You are  having difficultly using the spirometer.  You have trouble using the spirometer as often as instructed.  Your pain medication is not giving enough relief while using the spirometer.  You develop fever of 100.5 F (38.1 C) or higher. SEEK IMMEDIATE MEDICAL CARE IF:   You cough up bloody sputum that had not been present before.  You develop fever of 102 F (38.9 C) or greater.  You develop worsening pain at or near the incision site. MAKE SURE YOU:   Understand these instructions.  Will watch your condition.  Will get help right away if you are not doing well or get worse. Document Released: 09/20/2006 Document Revised: 08/02/2011 Document Reviewed: 11/21/2006 [facility] Patient Information 2014 [employer] [place].   ________________________________________________________________________

## 2018-10-20 NOTE — Progress Notes (Signed)
SPOKE W/ Pt     SCREENING SYMPTOMS OF COVID 19:   COUGH--No  RUNNY NOSE---No   SORE THROAT---No  NASAL CONGESTION----No  SNEEZING----Mp  SHORTNESS OF BREATH---No  DIFFICULTY BREATHING---No  TEMP >100.0 -----No  UNEXPLAINED BODY ACHES-----No-  CHILLS -------- No  HEADACHES ---------No  LOSS OF SMELL/ TASTE -------No-    HAVE YOU OR ANY FAMILY MEMBER TRAVELLED PAST 14 DAYS OUT OF THE   COUNTY---No STATE----No COUNTRY---No-  HAVE YOU OR ANY FAMILY MEMBER BEEN EXPOSED TO ANYONE WITH COVID 19? No

## 2018-10-23 ENCOUNTER — Encounter (HOSPITAL_COMMUNITY)
Admission: RE | Admit: 2018-10-23 | Discharge: 2018-10-23 | Disposition: A | Discharge status: Home / Self Care | Payer: Medicare Other | Source: Ambulatory Visit | Attending: Orthopaedic Surgery | Admitting: Orthopaedic Surgery

## 2018-10-23 ENCOUNTER — Encounter (HOSPITAL_COMMUNITY): Payer: Self-pay

## 2018-10-23 ENCOUNTER — Other Ambulatory Visit: Payer: Self-pay

## 2018-10-23 DIAGNOSIS — Z1159 Encounter for screening for other viral diseases: Secondary | ICD-10-CM | POA: Insufficient documentation

## 2018-10-23 DIAGNOSIS — M1612 Unilateral primary osteoarthritis, left hip: Secondary | ICD-10-CM | POA: Insufficient documentation

## 2018-10-23 DIAGNOSIS — Z01812 Encounter for preprocedural laboratory examination: Secondary | ICD-10-CM | POA: Diagnosis not present

## 2018-10-23 HISTORY — DX: Presence of cardiac pacemaker: Z95.0

## 2018-10-23 LAB — CBC
HCT: 43.9 % (ref 39.0–52.0)
Hemoglobin: 14.3 g/dL (ref 13.0–17.0)
MCH: 31.8 pg (ref 26.0–34.0)
MCHC: 32.6 g/dL (ref 30.0–36.0)
MCV: 97.6 fL (ref 80.0–100.0)
Platelets: 203 10*3/uL (ref 150–400)
RBC: 4.5 MIL/uL (ref 4.22–5.81)
RDW: 13.5 % (ref 11.5–15.5)
WBC: 8.3 10*3/uL (ref 4.0–10.5)
nRBC: 0 % (ref 0.0–0.2)

## 2018-10-23 LAB — BASIC METABOLIC PANEL
Anion gap: 8 (ref 5–15)
BUN: 20 mg/dL (ref 8–23)
CO2: 26 mmol/L (ref 22–32)
Calcium: 9.3 mg/dL (ref 8.9–10.3)
Chloride: 107 mmol/L (ref 98–111)
Creatinine, Ser: 0.93 mg/dL (ref 0.61–1.24)
GFR calc Af Amer: >60 mL/min (ref >60)
GFR calc non Af Amer: >60 mL/min (ref >60)
Glucose, Bld: 97 mg/dL (ref 70–99)
Potassium: 4.2 mmol/L (ref 3.5–5.1)
Sodium: 141 mmol/L (ref 135–145)

## 2018-10-23 LAB — SURGICAL PCR SCREEN
MRSA, PCR: NEGATIVE
Staphylococcus aureus: NEGATIVE

## 2018-10-23 LAB — HEMOGLOBIN A1C
Hgb A1c MFr Bld: 5.5 % (ref 4.8–5.6)
Mean Plasma Glucose: 111.15 mg/dL

## 2018-10-24 ENCOUNTER — Other Ambulatory Visit (HOSPITAL_COMMUNITY)
Admission: RE | Admit: 2018-10-24 | Discharge: 2018-10-24 | Disposition: A | Discharge status: Home / Self Care | Payer: Medicare Other | Source: Ambulatory Visit | Attending: Orthopaedic Surgery | Admitting: Orthopaedic Surgery

## 2018-10-24 DIAGNOSIS — Z01812 Encounter for preprocedural laboratory examination: Secondary | ICD-10-CM | POA: Diagnosis not present

## 2018-10-24 NOTE — Progress Notes (Signed)
Anesthesia Chart Review   Case:  [id number] Date/Time:  10/27/18 0930   Procedure:  LEFT TOTAL HIP ARTHROPLASTY ANTERIOR APPROACH (Left )   Anesthesia type:  Spinal   Pre-op diagnosis:  osteoarthritis left hip   Location:  [facility] 09 / [facility] OR[facility]   Surgeon:  Kathryne Hitch, MD      DISCUSSION: 78 yo former smoker with h/o barrett's esophagus, BPPV, BPH, HLD, pre-diabetic, RBB, A-fib (on Eliquis), pacemaker implanted 02/08/18 (perioperative prescription pending), left hip OA scheduled for above procedure 10/27/2018 with Dr. Doneen Poisson.   Pt last seen by PCP, Dr. Einar Crow, 09/15/2018.  Stable at this visit.    Last seen by cardiologist, Dr. Varney Baas, 06/30/2018.  Per Dr. Phylliss Bob note, "He is exercising on elliptical trainer 5-6 times a week for 30 to 35 minutes.  Since we last saw him, he denies chest pain, chest pressure/discomfort, claudication, lower extremity edema, orthopnea and paroxysmal nocturnal dyspnea."  Diltiazem increased from 360mg  to 180mg  at this visit due to short bursts of atrial tachycardia, PACs, and atrial fibrillation.  6 month follow up recommended.  He has been advised to hold Eliquis three days prior to procedure. Office visit scheduled for 12/22/2018.   Discussed with Dr. Armond Hang.  Pt can proceed with planned procedure barring acute status change.  VS: BP (!) 170/84 (BP Location: Left Arm)   Temp 36.9 C (Oral)   Resp 18   Ht 5\' 9"  (1.753 m)   Wt 92.6 kg   SpO2 99%   BMI 30.14 kg/m   PROVIDERS: Lauro Regulus, MD is PCP   Varney Baas, MD is Cardiologist  LABS: Labs reviewed: Acceptable for surgery. (all labs ordered are listed, but only abnormal results are displayed)  Labs Reviewed  SURGICAL PCR SCREEN  HEMOGLOBIN A1C  BASIC METABOLIC PANEL  CBC     IMAGES:   EKG: 10/10/2018 Rate 51 bpm Sinus bradycardia No significant change was found  CV: Stress Test 09/16/14 FUNCTIONAL RESULTS (calculated via Gated SPECT)  Post Stress Image LV EF: 53 %  Stress EDV: 112 ml EDVI: 50 ml/m TID:  Stress ESV: 52 ml ESVI: 23 ml/m  Wall Motion Wall Thickening  Anterior: Normal Normal  Apex: Normal Normal  Inferior: Normal Normal  Lateral: Normal Normal  Septal: Normal Normal  Echo 09/12/14 INTERPRETATION --------------------------------------------------------------- NORMAL LEFT VENTRICULAR SYSTOLIC FUNCTION WITH MILD LVH NORMAL LA PRESSURES WITH DIASTOLIC DYSFUNCTION NORMAL RIGHT VENTRICULAR SYSTOLIC FUNCTION VALVULAR REGURGITATION: TRIVIAL MR, TRIVIAL PR, TRIVIAL TR NO VALVULAR STENOSIS Compared with prior Echo study on 02/15/2011: No significant change Past Medical History:  Diagnosis Date  . Arthritis   . Barrett's esophagus   . BPH (benign prostatic hyperplasia)   . BPPV (benign paroxysmal positional vertigo)   . HBP (high blood pressure)   . Hyperlipidemia   . Prediabetes   . Presence of permanent cardiac pacemaker   . RBBB (right bundle branch block with left anterior fascicular block)   . Seborrheic keratosis   . Traumatic leg injury     Past Surgical History:  Procedure Laterality Date  . ESOPHAGOGASTRODUODENOSCOPY (EGD) WITH PROPOFOL N/A 03/27/2015   Procedure: ESOPHAGOGASTRODUODENOSCOPY (EGD) WITH PROPOFOL;  Surgeon: Wallace Cullens, MD;  Location: [facility] ENDOSCOPY;  Service: Gastroenterology;  Laterality: N/A;  . INSERT / REPLACE / REMOVE PACEMAKER    . SHOULDER SURGERY Bilateral   . TONSILLECTOMY      MEDICATIONS: . atorvastatin (LIPITOR) 40 MG tablet  . Cholecalciferol (VITAMIN D3) 2000 units capsule  .  diltiazem (DILACOR XR) 180 MG 24 hr capsule  . diphenhydrAMINE (BENADRYL) 25 MG tablet  . ELIQUIS 5 MG TABS tablet  . finasteride (PROSCAR) 5 MG tablet  . folic acid (FOLVITE) 800 MCG tablet  . methotrexate (RHEUMATREX) 2.5 MG tablet  . omeprazole (PRILOSEC) 20 MG capsule  . tamsulosin (FLOMAX) 0.4 MG CAPS capsule  . traZODone (DESYREL) 50 MG tablet  . Wheat  Dextrin (BENEFIBER) POWD   . methylPREDNISolone acetate (DEPO-MEDROL) injection 40 mg    Janey Genta [facility] Pre-Surgical Testing [phone] 10/25/18 11:51 AM

## 2018-10-25 LAB — NOVEL CORONAVIRUS, NAA (HOSP ORDER, SEND-OUT TO REF LAB; TAT 18-24 HRS): SARS-CoV-2, NAA: NOT DETECTED

## 2018-10-26 NOTE — Progress Notes (Signed)
SPOKE W/  _patient     SCREENING SYMPTOMS OF COVID 19:   COUGH--no  RUNNY NOSE--- no  SORE THROAT---no  NASAL CONGESTION----no  SNEEZING----no  SHORTNESS OF BREATH---no  DIFFICULTY BREATHING---no  TEMP >100.0 -----no  UNEXPLAINED BODY ACHES------no  CHILLS --------no   HEADACHES ---------no  LOSS OF SMELL/ TASTE --------no    HAVE YOU OR ANY FAMILY MEMBER TRAVELLED PAST 14 DAYS OUT OF THE   COUNTY---no STATE----no COUNTRY----no  HAVE YOU OR ANY FAMILY MEMBER BEEN EXPOSED TO ANYONE WITH COVID 19? no    

## 2018-10-26 NOTE — Anesthesia Preprocedure Evaluation (Addendum)
Anesthesia Evaluation  Patient identified by MRN, date of birth, ID band Patient awake    Reviewed: Allergy & Precautions, NPO status , Patient's Chart, lab work & pertinent test results  History of Anesthesia Complications Negative for: history of anesthetic complications  Airway Mallampati: II  TM Distance: >3 FB Neck ROM: Full    Dental  (+) Teeth Intact, Dental Advisory Given   Pulmonary former smoker,    Pulmonary exam normal breath sounds clear to auscultation       Cardiovascular hypertension, Pt. on medications (-) angina(-) CAD, (-) Past MI and (-) Cardiac Stents Normal cardiovascular exam+ dysrhythmias Atrial Fibrillation + pacemaker  Rhythm:Regular Rate:Normal     Neuro/Psych  Neuromuscular disease negative psych ROS   GI/Hepatic Neg liver ROS, GERD  Medicated,  Endo/Other  Obesity   Renal/GU negative Renal ROS     Musculoskeletal  (+) Arthritis , Osteoarthritis,    Abdominal   Peds  Hematology  (+) Blood dyscrasia (Eliquis), ,   Anesthesia Other Findings   Reproductive/Obstetrics                            Anesthesia Physical Anesthesia Plan  ASA: III  Anesthesia Plan: Spinal   Post-op Pain Management:    Induction:   PONV Risk Score and Plan: 1 and Propofol infusion, Treatment may vary due to age or medical condition and Ondansetron  Airway Management Planned: Natural Airway and Nasal Cannula  Additional Equipment:   Intra-op Plan:   Post-operative Plan:   Informed Consent: I have reviewed the patients History and Physical, chart, labs and discussed the procedure including the risks, benefits and alternatives for the proposed anesthesia with the patient or authorized representative who has indicated his/her understanding and acceptance.     Dental advisory given  Plan Discussed with: CRNA, Anesthesiologist and Surgeon  Anesthesia Plan Comments:         Anesthesia Quick Evaluation

## 2018-10-26 NOTE — Progress Notes (Signed)
Perioperative Prescription for Implanted Cardiac Device faxed to Dr. Christiana Pellant x 2. Also spoke with Nursed [place] and [place]. Per nurses,  form has been routed to provider with 'high priority', and is awaiting completion

## 2018-10-27 ENCOUNTER — Ambulatory Visit (HOSPITAL_COMMUNITY): Payer: Medicare Other | Admitting: Physician Assistant

## 2018-10-27 ENCOUNTER — Encounter (HOSPITAL_COMMUNITY): Payer: Self-pay

## 2018-10-27 ENCOUNTER — Encounter (HOSPITAL_COMMUNITY)
Admission: RE | Disposition: A | Discharge status: Home Health | Payer: Self-pay | Source: Home / Self Care | Attending: Orthopaedic Surgery

## 2018-10-27 ENCOUNTER — Inpatient Hospital Stay (HOSPITAL_COMMUNITY): Payer: Medicare Other

## 2018-10-27 ENCOUNTER — Ambulatory Visit (HOSPITAL_COMMUNITY): Payer: Medicare Other | Admitting: Anesthesiology

## 2018-10-27 ENCOUNTER — Other Ambulatory Visit: Payer: Self-pay

## 2018-10-27 ENCOUNTER — Ambulatory Visit (HOSPITAL_COMMUNITY): Payer: Medicare Other

## 2018-10-27 ENCOUNTER — Observation Stay (HOSPITAL_COMMUNITY)
Admission: RE | Admit: 2018-10-27 | Discharge: 2018-10-28 | Disposition: A | Discharge status: Home Health | Payer: Medicare Other | Attending: Orthopaedic Surgery | Admitting: Orthopaedic Surgery

## 2018-10-27 DIAGNOSIS — K2270 Barrett's esophagus without dysplasia: Secondary | ICD-10-CM | POA: Diagnosis not present

## 2018-10-27 DIAGNOSIS — G709 Myoneural disorder, unspecified: Secondary | ICD-10-CM | POA: Diagnosis not present

## 2018-10-27 DIAGNOSIS — M069 Rheumatoid arthritis, unspecified: Secondary | ICD-10-CM | POA: Diagnosis not present

## 2018-10-27 DIAGNOSIS — K219 Gastro-esophageal reflux disease without esophagitis: Secondary | ICD-10-CM | POA: Diagnosis not present

## 2018-10-27 DIAGNOSIS — I4510 Unspecified right bundle-branch block: Secondary | ICD-10-CM | POA: Insufficient documentation

## 2018-10-27 DIAGNOSIS — M19031 Primary osteoarthritis, right wrist: Secondary | ICD-10-CM | POA: Diagnosis not present

## 2018-10-27 DIAGNOSIS — Z6830 Body mass index (BMI) 30.0-30.9, adult: Secondary | ICD-10-CM | POA: Diagnosis not present

## 2018-10-27 DIAGNOSIS — Z79899 Other long term (current) drug therapy: Secondary | ICD-10-CM | POA: Diagnosis not present

## 2018-10-27 DIAGNOSIS — R7303 Prediabetes: Secondary | ICD-10-CM | POA: Insufficient documentation

## 2018-10-27 DIAGNOSIS — Z950 Presence of cardiac pacemaker: Secondary | ICD-10-CM | POA: Diagnosis not present

## 2018-10-27 DIAGNOSIS — L821 Other seborrheic keratosis: Secondary | ICD-10-CM | POA: Insufficient documentation

## 2018-10-27 DIAGNOSIS — M25552 Pain in left hip: Secondary | ICD-10-CM

## 2018-10-27 DIAGNOSIS — Z96642 Presence of left artificial hip joint: Secondary | ICD-10-CM

## 2018-10-27 DIAGNOSIS — E669 Obesity, unspecified: Secondary | ICD-10-CM | POA: Insufficient documentation

## 2018-10-27 DIAGNOSIS — Z7901 Long term (current) use of anticoagulants: Secondary | ICD-10-CM | POA: Diagnosis not present

## 2018-10-27 DIAGNOSIS — I4891 Unspecified atrial fibrillation: Secondary | ICD-10-CM | POA: Insufficient documentation

## 2018-10-27 DIAGNOSIS — N400 Enlarged prostate without lower urinary tract symptoms: Secondary | ICD-10-CM | POA: Diagnosis not present

## 2018-10-27 DIAGNOSIS — E785 Hyperlipidemia, unspecified: Secondary | ICD-10-CM | POA: Insufficient documentation

## 2018-10-27 DIAGNOSIS — M1612 Unilateral primary osteoarthritis, left hip: Principal | ICD-10-CM | POA: Insufficient documentation

## 2018-10-27 DIAGNOSIS — Z87891 Personal history of nicotine dependence: Secondary | ICD-10-CM | POA: Insufficient documentation

## 2018-10-27 DIAGNOSIS — I10 Essential (primary) hypertension: Secondary | ICD-10-CM | POA: Diagnosis not present

## 2018-10-27 HISTORY — PX: TOTAL HIP ARTHROPLASTY: SHX124

## 2018-10-27 SURGERY — ARTHROPLASTY, HIP, TOTAL, ANTERIOR APPROACH
Anesthesia: General | Laterality: Left

## 2018-10-27 MED ORDER — SODIUM CHLORIDE 0.9 % IR SOLN
Status: DC | PRN
  Administered 2018-10-27: 1000 mL

## 2018-10-27 MED ORDER — METHOCARBAMOL 500 MG PO TABS
500.0000 mg | ORAL_TABLET | Freq: Four times a day (QID) | ORAL | Status: DC | PRN
  Administered 2018-10-28: 500 mg via ORAL
  Filled 2018-10-27: qty 1

## 2018-10-27 MED ORDER — POLYETHYLENE GLYCOL 3350 17 G PO PACK: 17.0000 g | PACK | Freq: Every day | ORAL | Status: DC | PRN

## 2018-10-27 MED ORDER — SUGAMMADEX SODIUM 200 MG/2ML IV SOLN
INTRAVENOUS | Status: AC
  Filled 2018-10-27: qty 2

## 2018-10-27 MED ORDER — FINASTERIDE 5 MG PO TABS
5.0000 mg | ORAL_TABLET | Freq: Every day | ORAL | Status: DC
  Administered 2018-10-27 – 2018-10-28 (×2): 5 mg via ORAL
  Filled 2018-10-27 (×2): qty 1

## 2018-10-27 MED ORDER — EPHEDRINE 5 MG/ML INJ
INTRAVENOUS | Status: AC
  Filled 2018-10-27: qty 10

## 2018-10-27 MED ORDER — METHOCARBAMOL 500 MG IVPB - SIMPLE MED
500.0000 mg | Freq: Four times a day (QID) | INTRAVENOUS | Status: DC | PRN
  Administered 2018-10-27: 500 mg via INTRAVENOUS
  Filled 2018-10-27: qty 50

## 2018-10-27 MED ORDER — MIDAZOLAM HCL 5 MG/5ML IJ SOLN
INTRAMUSCULAR | Status: DC | PRN
  Administered 2018-10-27 (×2): 1 mg via INTRAVENOUS

## 2018-10-27 MED ORDER — DOCUSATE SODIUM 100 MG PO CAPS
100.0000 mg | ORAL_CAPSULE | Freq: Two times a day (BID) | ORAL | Status: DC
  Administered 2018-10-27 – 2018-10-28 (×2): 100 mg via ORAL
  Filled 2018-10-27 (×2): qty 1

## 2018-10-27 MED ORDER — ALUM & MAG HYDROXIDE-SIMETH 200-200-20 MG/5ML PO SUSP: 30.0000 mL | ORAL | Status: DC | PRN

## 2018-10-27 MED ORDER — PHENOL 1.4 % MT LIQD: 1.0000 | OROMUCOSAL | Status: DC | PRN

## 2018-10-27 MED ORDER — OXYCODONE HCL 5 MG PO TABS
5.0000 mg | ORAL_TABLET | ORAL | Status: DC | PRN
  Administered 2018-10-27 – 2018-10-28 (×4): 10 mg via ORAL
  Filled 2018-10-27 (×4): qty 2

## 2018-10-27 MED ORDER — ONDANSETRON HCL 4 MG/2ML IJ SOLN
INTRAMUSCULAR | Status: AC
  Filled 2018-10-27: qty 2

## 2018-10-27 MED ORDER — EPHEDRINE SULFATE-NACL 50-0.9 MG/10ML-% IV SOSY
PREFILLED_SYRINGE | INTRAVENOUS | Status: DC | PRN
  Administered 2018-10-27: 10 mg via INTRAVENOUS
  Administered 2018-10-27: 5 mg via INTRAVENOUS

## 2018-10-27 MED ORDER — ONDANSETRON HCL 4 MG/2ML IJ SOLN
INTRAMUSCULAR | Status: DC | PRN
  Administered 2018-10-27: 4 mg via INTRAVENOUS

## 2018-10-27 MED ORDER — HYDROMORPHONE HCL 1 MG/ML IJ SOLN: 0.5000 mg | INTRAMUSCULAR | Status: DC | PRN

## 2018-10-27 MED ORDER — PANTOPRAZOLE SODIUM 40 MG PO TBEC
40.0000 mg | DELAYED_RELEASE_TABLET | Freq: Every day | ORAL | Status: DC
  Administered 2018-10-28: 40 mg via ORAL
  Filled 2018-10-27: qty 1

## 2018-10-27 MED ORDER — ROCURONIUM BROMIDE 10 MG/ML (PF) SYRINGE
PREFILLED_SYRINGE | INTRAVENOUS | Status: DC | PRN
  Administered 2018-10-27: 40 mg via INTRAVENOUS

## 2018-10-27 MED ORDER — FENTANYL CITRATE (PF) 100 MCG/2ML IJ SOLN
INTRAMUSCULAR | Status: AC
  Filled 2018-10-27: qty 2

## 2018-10-27 MED ORDER — ATORVASTATIN CALCIUM 40 MG PO TABS
40.0000 mg | ORAL_TABLET | Freq: Every day | ORAL | Status: DC
  Administered 2018-10-27: 40 mg via ORAL
  Filled 2018-10-27: qty 1

## 2018-10-27 MED ORDER — ACETAMINOPHEN 325 MG PO TABS: 325.0000 mg | ORAL_TABLET | Freq: Four times a day (QID) | ORAL | Status: DC | PRN

## 2018-10-27 MED ORDER — TAMSULOSIN HCL 0.4 MG PO CAPS
0.4000 mg | ORAL_CAPSULE | Freq: Every day | ORAL | Status: DC
  Administered 2018-10-27 – 2018-10-28 (×2): 0.4 mg via ORAL
  Filled 2018-10-27 (×2): qty 1

## 2018-10-27 MED ORDER — ACETAMINOPHEN 500 MG PO TABS
1000.0000 mg | ORAL_TABLET | Freq: Once | ORAL | Status: AC
  Administered 2018-10-27: 1000 mg via ORAL
  Filled 2018-10-27: qty 2

## 2018-10-27 MED ORDER — KETOROLAC TROMETHAMINE 15 MG/ML IJ SOLN
15.0000 mg | Freq: Four times a day (QID) | INTRAMUSCULAR | Status: AC
  Administered 2018-10-27 – 2018-10-28 (×3): 15 mg via INTRAVENOUS
  Filled 2018-10-27 (×3): qty 1

## 2018-10-27 MED ORDER — HYDROMORPHONE HCL 1 MG/ML IJ SOLN
INTRAMUSCULAR | Status: AC
  Filled 2018-10-27: qty 1

## 2018-10-27 MED ORDER — CEFAZOLIN SODIUM-DEXTROSE 2-4 GM/100ML-% IV SOLN
2.0000 g | INTRAVENOUS | Status: AC
  Administered 2018-10-27: 2 g via INTRAVENOUS
  Filled 2018-10-27: qty 100

## 2018-10-27 MED ORDER — FOLIC ACID 1 MG PO TABS
1.0000 mg | ORAL_TABLET | Freq: Every day | ORAL | Status: DC
  Administered 2018-10-27 – 2018-10-28 (×2): 1 mg via ORAL
  Filled 2018-10-27 (×2): qty 1

## 2018-10-27 MED ORDER — PROPOFOL 10 MG/ML IV BOLUS
INTRAVENOUS | Status: AC
  Filled 2018-10-27: qty 60

## 2018-10-27 MED ORDER — APIXABAN 5 MG PO TABS
5.0000 mg | ORAL_TABLET | Freq: Two times a day (BID) | ORAL | Status: DC
  Administered 2018-10-28: 5 mg via ORAL
  Filled 2018-10-27: qty 1

## 2018-10-27 MED ORDER — VITAMIN D 25 MCG (1000 UNIT) PO TABS
2000.0000 [IU] | ORAL_TABLET | Freq: Every day | ORAL | Status: DC
  Administered 2018-10-27 – 2018-10-28 (×2): 2000 [IU] via ORAL
  Filled 2018-10-27 (×2): qty 2

## 2018-10-27 MED ORDER — PROPOFOL 10 MG/ML IV BOLUS
INTRAVENOUS | Status: DC | PRN
  Administered 2018-10-27: 40 mg via INTRAVENOUS
  Administered 2018-10-27: 150 mg via INTRAVENOUS

## 2018-10-27 MED ORDER — HYDROMORPHONE HCL 1 MG/ML IJ SOLN
0.5000 mg | Freq: Once | INTRAMUSCULAR | Status: AC
  Administered 2018-10-27: 0.5 mg via INTRAVENOUS

## 2018-10-27 MED ORDER — GABAPENTIN 300 MG PO CAPS
300.0000 mg | ORAL_CAPSULE | Freq: Once | ORAL | Status: AC
  Administered 2018-10-27: 300 mg via ORAL
  Filled 2018-10-27: qty 1

## 2018-10-27 MED ORDER — DILTIAZEM HCL ER COATED BEADS 180 MG PO CP24
180.0000 mg | ORAL_CAPSULE | Freq: Two times a day (BID) | ORAL | Status: DC
  Administered 2018-10-27 – 2018-10-28 (×2): 180 mg via ORAL
  Filled 2018-10-27 (×2): qty 1

## 2018-10-27 MED ORDER — BUPIVACAINE IN DEXTROSE 0.75-8.25 % IT SOLN
INTRATHECAL | Status: DC | PRN
  Administered 2018-10-27: 1.6 mL via INTRATHECAL

## 2018-10-27 MED ORDER — SUCCINYLCHOLINE CHLORIDE 200 MG/10ML IV SOSY
PREFILLED_SYRINGE | INTRAVENOUS | Status: DC | PRN
  Administered 2018-10-27: 100 mg via INTRAVENOUS

## 2018-10-27 MED ORDER — MENTHOL 3 MG MT LOZG: 1.0000 | LOZENGE | OROMUCOSAL | Status: DC | PRN

## 2018-10-27 MED ORDER — FENTANYL CITRATE (PF) 100 MCG/2ML IJ SOLN
INTRAMUSCULAR | Status: DC | PRN
  Administered 2018-10-27: 100 ug via INTRAVENOUS

## 2018-10-27 MED ORDER — CHLORHEXIDINE GLUCONATE 4 % EX LIQD: 60.0000 mL | Freq: Once | CUTANEOUS | Status: DC

## 2018-10-27 MED ORDER — METHOCARBAMOL 500 MG IVPB - SIMPLE MED
INTRAVENOUS | Status: AC
  Filled 2018-10-27: qty 50

## 2018-10-27 MED ORDER — SUGAMMADEX SODIUM 200 MG/2ML IV SOLN
INTRAVENOUS | Status: DC | PRN
  Administered 2018-10-27: 200 mg via INTRAVENOUS

## 2018-10-27 MED ORDER — ONDANSETRON HCL 4 MG PO TABS: 4.0000 mg | ORAL_TABLET | Freq: Four times a day (QID) | ORAL | Status: DC | PRN

## 2018-10-27 MED ORDER — ONDANSETRON HCL 4 MG/2ML IJ SOLN: 4.0000 mg | Freq: Once | INTRAMUSCULAR | Status: DC | PRN

## 2018-10-27 MED ORDER — ONDANSETRON HCL 4 MG/2ML IJ SOLN: 4.0000 mg | Freq: Four times a day (QID) | INTRAMUSCULAR | Status: DC | PRN

## 2018-10-27 MED ORDER — SODIUM CHLORIDE 0.9 % IV SOLN
INTRAVENOUS | Status: DC
  Administered 2018-10-27: 75 mL/h via INTRAVENOUS
  Administered 2018-10-28: 07:00:00 via INTRAVENOUS

## 2018-10-27 MED ORDER — CEFAZOLIN SODIUM-DEXTROSE 1-4 GM/50ML-% IV SOLN
1.0000 g | Freq: Four times a day (QID) | INTRAVENOUS | Status: AC
  Administered 2018-10-27 – 2018-10-28 (×2): 1 g via INTRAVENOUS
  Filled 2018-10-27 (×2): qty 50

## 2018-10-27 MED ORDER — METOCLOPRAMIDE HCL 5 MG/ML IJ SOLN: 5.0000 mg | Freq: Three times a day (TID) | INTRAMUSCULAR | Status: DC | PRN

## 2018-10-27 MED ORDER — DIPHENHYDRAMINE HCL 12.5 MG/5ML PO ELIX: 12.5000 mg | ORAL_SOLUTION | ORAL | Status: DC | PRN

## 2018-10-27 MED ORDER — POVIDONE-IODINE 10 % EX SWAB
2.0000 "application " | Freq: Once | CUTANEOUS | Status: AC
  Administered 2018-10-27: 2 via TOPICAL

## 2018-10-27 MED ORDER — LACTATED RINGERS IV SOLN
INTRAVENOUS | Status: DC
  Administered 2018-10-27 (×2): via INTRAVENOUS

## 2018-10-27 MED ORDER — SODIUM CHLORIDE 0.9 % IV SOLN
INTRAVENOUS | Status: DC | PRN
  Administered 2018-10-27: 25 ug/min via INTRAVENOUS

## 2018-10-27 MED ORDER — FENTANYL CITRATE (PF) 100 MCG/2ML IJ SOLN
25.0000 ug | INTRAMUSCULAR | Status: DC | PRN
  Administered 2018-10-27: 25 ug via INTRAVENOUS
  Administered 2018-10-27: 50 ug via INTRAVENOUS
  Administered 2018-10-27: 25 ug via INTRAVENOUS
  Administered 2018-10-27: 50 ug via INTRAVENOUS

## 2018-10-27 MED ORDER — PROPOFOL 500 MG/50ML IV EMUL
INTRAVENOUS | Status: DC | PRN
  Administered 2018-10-27: 50 ug/kg/min via INTRAVENOUS

## 2018-10-27 MED ORDER — TRANEXAMIC ACID-NACL 1000-0.7 MG/100ML-% IV SOLN
1000.0000 mg | INTRAVENOUS | Status: AC
  Administered 2018-10-27: 1000 mg via INTRAVENOUS
  Filled 2018-10-27: qty 100

## 2018-10-27 MED ORDER — OXYCODONE HCL 5 MG PO TABS: 10.0000 mg | ORAL_TABLET | ORAL | Status: DC | PRN

## 2018-10-27 MED ORDER — TRAZODONE HCL 50 MG PO TABS: 50.0000 mg | ORAL_TABLET | Freq: Every evening | ORAL | Status: DC | PRN

## 2018-10-27 MED ORDER — MIDAZOLAM HCL 2 MG/2ML IJ SOLN
INTRAMUSCULAR | Status: AC
  Filled 2018-10-27: qty 2

## 2018-10-27 MED ORDER — METOCLOPRAMIDE HCL 5 MG PO TABS: 5.0000 mg | ORAL_TABLET | Freq: Three times a day (TID) | ORAL | Status: DC | PRN

## 2018-10-27 SURGICAL SUPPLY — 44 items
BAG ZIPLOCK 12X15 (MISCELLANEOUS) IMPLANT
BENZOIN TINCTURE PRP APPL 2/3 (GAUZE/BANDAGES/DRESSINGS) IMPLANT
BLADE SAW SGTL 18X1.27X75 (BLADE) ×2 IMPLANT
BLADE SAW SGTL 18X1.27X75MM (BLADE) ×1
BLADE SURG SZ10 CARB STEEL (BLADE) ×6 IMPLANT
CLOSURE WOUND 1/2 X4 (GAUZE/BANDAGES/DRESSINGS)
COVER PERINEAL POST (MISCELLANEOUS) ×3 IMPLANT
COVER SURGICAL LIGHT HANDLE (MISCELLANEOUS) ×3 IMPLANT
COVER WAND RF STERILE (DRAPES) IMPLANT
DRAPE STERI IOBAN 125X83 (DRAPES) ×3 IMPLANT
DRAPE U-SHAPE 47X51 STRL (DRAPES) ×6 IMPLANT
DRESSING AQUACEL AG SP 3.5X10 (GAUZE/BANDAGES/DRESSINGS) IMPLANT
DRSG AQUACEL AG ADV 3.5X10 (GAUZE/BANDAGES/DRESSINGS) ×3 IMPLANT
DRSG AQUACEL AG SP 3.5X10 (GAUZE/BANDAGES/DRESSINGS) ×3
DURAPREP 26ML APPLICATOR (WOUND CARE) ×3 IMPLANT
ELECT REM PT RETURN 15FT ADLT (MISCELLANEOUS) ×3 IMPLANT
FACESHIELD WRAPAROUND (MASK) ×3 IMPLANT
FACESHIELD WRAPAROUND OR TEAM (MASK) IMPLANT
GAUZE XEROFORM 1X8 LF (GAUZE/BANDAGES/DRESSINGS) ×2 IMPLANT
GLOVE BIO SURGEON STRL SZ7.5 (GLOVE) ×3 IMPLANT
GLOVE BIOGEL PI IND STRL 8 (GLOVE) ×2 IMPLANT
GLOVE BIOGEL PI INDICATOR 8 (GLOVE) ×4
GLOVE ECLIPSE 8.0 STRL XLNG CF (GLOVE) ×3 IMPLANT
GOWN STRL REUS W/TWL XL LVL3 (GOWN DISPOSABLE) ×6 IMPLANT
HANDPIECE INTERPULSE COAX TIP (DISPOSABLE) ×2
HEAD M SROM 36MM PLUS 1.5 (Hips) IMPLANT
HOLDER FOLEY CATH W/STRAP (MISCELLANEOUS) ×5 IMPLANT
KIT TURNOVER KIT A (KITS) IMPLANT
LINER NEUTRAL 52X36MM PLUS 4 (Liner) ×2 IMPLANT
PACK ANTERIOR HIP CUSTOM (KITS) ×3 IMPLANT
PIN SECTOR W/GRIP ACE CUP 52MM (Hips) ×2 IMPLANT
SET HNDPC FAN SPRY TIP SCT (DISPOSABLE) ×1 IMPLANT
SROM M HEAD 36MM PLUS 1.5 (Hips) ×3 IMPLANT
STAPLER VISISTAT 35W (STAPLE) ×2 IMPLANT
STEM FEM ACTIS HIGH SZ8 (Stem) ×2 IMPLANT
STRIP CLOSURE SKIN 1/2X4 (GAUZE/BANDAGES/DRESSINGS) IMPLANT
SUT ETHIBOND NAB CT1 #1 30IN (SUTURE) ×5 IMPLANT
SUT MNCRL AB 4-0 PS2 18 (SUTURE) IMPLANT
SUT VIC AB 0 CT1 36 (SUTURE) ×3 IMPLANT
SUT VIC AB 1 CT1 36 (SUTURE) ×3 IMPLANT
SUT VIC AB 2-0 CT1 27 (SUTURE) ×4
SUT VIC AB 2-0 CT1 TAPERPNT 27 (SUTURE) ×2 IMPLANT
TRAY FOLEY MTR SLVR 16FR STAT (SET/KITS/TRAYS/PACK) ×3 IMPLANT
YANKAUER SUCT BULB TIP 10FT TU (MISCELLANEOUS) ×3 IMPLANT

## 2018-10-27 NOTE — Anesthesia Procedure Notes (Signed)
Procedure Name: Intubation Date/Time: 10/27/2018 11:42 AM Performed by: Elyn Peers, CRNA Pre-anesthesia Checklist: Patient identified, Emergency Drugs available, Suction available, Patient being monitored and Timeout performed Patient Re-evaluated:Patient Re-evaluated prior to induction Oxygen Delivery Method: Circle system utilized Preoxygenation: Pre-oxygenation with 100% oxygen Induction Type: IV induction and Rapid sequence Laryngoscope Size: Miller and 3 Grade View: Grade III Tube type: Oral Tube size: 7.5 mm Number of attempts: 1 Airway Equipment and Method: Stylet Placement Confirmation: ETT inserted through vocal cords under direct vision,  positive ETCO2 and breath sounds checked- equal and bilateral Secured at: 22 cm Tube secured with: Tape Dental Injury: Teeth and Oropharynx as per pre-operative assessment  Comments: Base of laryngeal opening view only.

## 2018-10-27 NOTE — Brief Op Note (Signed)
10/27/2018  12:47 PM  PATIENT:  [name]  78 y.o. male  PRE-OPERATIVE DIAGNOSIS:  osteoarthritis left hip  POST-OPERATIVE DIAGNOSIS:  osteoarthritis left hip  PROCEDURE:  Procedure(s): LEFT TOTAL HIP ARTHROPLASTY ANTERIOR APPROACH (Left)  SURGEON:  Surgeon(s) and Role:    Kathryne Hitch, MD - Primary  PHYSICIAN ASSISTANT: Rexene Edison, PA-C  ANESTHESIA:   spinal and general  EBL:  500 mL   COUNTS:  YES  DICTATION: .Other Dictation: Dictation Number [id number]  PLAN OF CARE: Admit to inpatient   PATIENT DISPOSITION:  PACU - hemodynamically stable.   Delay start of Pharmacological VTE agent (>24hrs) due to surgical blood loss or risk of bleeding: no

## 2018-10-27 NOTE — Anesthesia Procedure Notes (Addendum)
Spinal  Patient location during procedure: OR Start time: 10/27/2018 11:18 AM End time: 10/27/2018 11:23 AM Staffing Resident/CRNA: Lollie Sails, CRNA Performed: resident/CRNA  Preanesthetic Checklist Completed: patient identified, site marked, surgical consent, pre-op evaluation, timeout performed, IV checked, risks and benefits discussed and monitors and equipment checked Spinal Block Patient position: sitting Prep: site prepped and draped and DuraPrep Patient monitoring: heart rate, continuous pulse ox, blood pressure and cardiac monitor Approach: midline Location: L3-4 Injection technique: single-shot Needle Needle type: Sprotte  Needle gauge: 24 G Needle length: 10 cm Assessment Events: failed spinal Additional Notes Expiration date on kit noted and within range.  Good CSF flow noted with no heme or c/o paresthesia.   Patient tolerated well.

## 2018-10-27 NOTE — Evaluation (Signed)
Physical Therapy Evaluation Patient Details Name: [name] MRN: [id number] DOB: [birth year] Today's Date: 10/27/2018   History of Present Illness  s/p L DA THA PMHx: BPPV, R BBBB, RA, bil shoulder surgery  Clinical Impression  Pt is s/p THA resulting in the deficits listed below (see PT Problem List). Assisted pt EOB, sit to stand xfer with min assist, however pt c/o dizziness in standing which increased, pt became mildly diaphoretic. Returned pt  to seated position, then supine with no LOC; VSS with BP 106/79, SpO2=96% and HR in 70s. RN aware.  Pt comfortable and without complaints once back to bed. Will see again in am.   Pt will benefit from skilled PT to increase their independence and safety with mobility to allow discharge to the venue listed below.      Follow Up Recommendations Follow surgeon's recommendation for DC plan and follow-up therapies;Home health PT    Equipment Recommendations  None recommended by PT    Recommendations for Other Services       Precautions / Restrictions Precautions Precautions: Fall Restrictions Weight Bearing Restrictions: No Other Position/Activity Restrictions: WBAT      Mobility  Bed Mobility Overal bed mobility: Needs Assistance Bed Mobility: Supine to Sit     Supine to sit: Min assist     General bed mobility comments: assist with LLE, incr time, cues for technique  Transfers Overall transfer level: Needs assistance Equipment used: Rolling walker (2 wheeled) Transfers: Sit to/from Stand Sit to Stand: Min assist;+2 safety/equipment         General transfer comment: assist for anterior-superior wt shift and transition to RW; pt dizzy upon standing, dizziness increased so had pt take 2  lateral steps along EOB and then return to sit/supine  Ambulation/Gait                Stairs            Wheelchair Mobility    Modified Rankin (Stroke Patients Only)       Balance                                              Pertinent Vitals/Pain Pain Assessment: 0-10 Pain Score: 7  Pain Location: Left hip Pain Descriptors / Indicators: Discomfort;Sore Pain Intervention(s): Limited activity within patient's tolerance;Monitored during session    Home Living Family/patient expects to be discharged to:: Private residence Living Arrangements: Spouse/significant other   Type of Home: House Home Access: Stairs to enter   Secretary/administrator of Steps: 1 and 1/2 Home Layout: Two level;Bed/bath upstairs Home Equipment: Walker - 2 wheels      Prior Function Level of Independence: Independent               Hand Dominance        Extremity/Trunk Assessment   Upper Extremity Assessment Upper Extremity Assessment: Overall WFL for tasks assessed    Lower Extremity Assessment Lower Extremity Assessment: LLE deficits/detail LLE Deficits / Details: ankle WFL, knee and hip grossly 2+/5, llimited by postop pain.        Communication   Communication: No difficulties  Cognition Arousal/Alertness: Awake/alert Behavior During Therapy: WFL for tasks assessed/performed Overall Cognitive Status: Within Functional Limits for tasks assessed  General Comments      Exercises Total Joint Exercises Ankle Circles/Pumps: AROM;Both;10 reps   Assessment/Plan    PT Assessment Patient needs continued PT services  PT Problem List Decreased strength;Decreased mobility;Decreased activity tolerance;Decreased knowledge of use of DME;Pain       PT Treatment Interventions DME instruction;Gait training;Functional mobility training;Therapeutic activities;Stair training;Therapeutic exercise;Patient/family education    PT Goals (Current goals can be found in the Care Plan section)  Acute Rehab PT Goals Patient Stated Goal: have less pain PT Goal Formulation: With patient Time For Goal Achievement: 11/03/18 Potential to  Achieve Goals: Good    Frequency 7X/week   Barriers to discharge        Co-evaluation               AM-PAC PT "6 Clicks" Mobility  Outcome Measure Help needed turning from your back to your side while in a flat bed without using bedrails?: A Little Help needed moving from lying on your back to sitting on the side of a flat bed without using bedrails?: A Little Help needed moving to and from a bed to a chair (including a wheelchair)?: A Lot Help needed standing up from a chair using your arms (e.g., wheelchair or bedside chair)?: A Lot Help needed to walk in hospital room?: A Lot Help needed climbing 3-5 steps with a railing? : Total 6 Click Score: 13    End of Session Equipment Utilized During Treatment: Gait belt Activity Tolerance: Treatment limited secondary to medical complications (Comment)(mild diaphoresis) Patient left: in bed;with call bell/phone within reach;with bed alarm set Nurse Communication: Other (comment)(dizziness) PT Visit Diagnosis: Difficulty in walking, not elsewhere classified (R26.2)    Time: 9093-1121 PT Time Calculation (min) (ACUTE ONLY): 16 min   Charges:   PT Evaluation $PT Eval Low Complexity: 1 Low          Drucilla Chalet, PT  Pager: [phone] Acute Rehab Dept [facility]): [phone]   10/27/2018   [facility] 10/27/2018, 6:17 PM

## 2018-10-27 NOTE — Transfer of Care (Signed)
Immediate Anesthesia Transfer of Care Note  Patient: JAEN SCHILDT  Procedure(s) Performed: LEFT TOTAL HIP ARTHROPLASTY ANTERIOR APPROACH (Left )  Patient Location: PACU  Anesthesia Type:General and Spinal  Level of Consciousness: awake  Airway & Oxygen Therapy: Patient Spontanous Breathing and Patient connected to face mask oxygen  Post-op Assessment: Report given to RN, Post -op Vital signs reviewed and stable and patient moving legs, c/o nausea.  Post vital signs: Reviewed and stable  Last Vitals:  Vitals Value Taken Time  BP 111/62 10/27/2018  1:08 PM  Temp    Pulse 62 10/27/2018  1:11 PM  Resp 16 10/27/2018  1:11 PM  SpO2 100 % 10/27/2018  1:11 PM  Vitals shown include unvalidated device data.  Last Pain:  Vitals:   10/27/18 0738  TempSrc:   PainSc: 0-No pain      Patients Stated Pain Goal: 3 (10/27/18 4497)  Complications: No apparent anesthesia complications

## 2018-10-27 NOTE — Op Note (Signed)
NAME: [name], [name] MEDICAL RECORD NI:[id number] ACCOUNT [id number] DATE OF BIRTH:08-31-1940 FACILITY: [facility] LOCATION: [facility] PHYSICIAN:Masen Luallen Aretha Parrot, MD  OPERATIVE REPORT  DATE OF PROCEDURE:  10/27/2018  PREOPERATIVE DIAGNOSIS:  Primary osteoarthritis and degenerative joint disease, left hip.  POSTOPERATIVE DIAGNOSIS:  Primary osteoarthritis and degenerative joint disease, left hip.  PROCEDURE:  Left total hip arthroplasty through direct anterior approach.  IMPLANTS:  DePuy Sector Gription acetabular component size 52, size 36+4 neutral polyethylene liner, size 8 Actis femoral component with high offset, size 36+1.5 metal hip ball.  SURGEON:  Vanita Panda. Magnus Ivan, MD  ASSISTANT:  Richardean Canal, PA-C  ANESTHESIA: 1.  Attempted spinal. 2.  General.  ESTIMATED BLOOD LOSS:  500 mL.  ANTIBIOTICS:  Two grams IV Ancef.  COMPLICATIONS:  None.  INDICATIONS:  The patient is a 78 year old gentleman well known to me.  He has debilitating arthritis involving his left hip.  It is well documented.  At this point, he has tried and failed all forms of conservative treatment.  He has had an  intraarticular injection, which helped greatly, but then that wore off.  His pain is daily and has detrimentally affected his mobility, his activities of daily living, and his quality of life to the point he does wish to proceed with a total hip  arthroplasty.  His x-rays do show severe bone-on-bone arthritis of the left hip.  At this point, we talked about the risk of acute blood loss anemia, nerve and vessel injury, fracture, infection, dislocation, DVT and implant failure.  We talked about our  goals with total hip arthroplasty being decreased pain, improved mobility, and overall improved quality of life.  DESCRIPTION OF PROCEDURE:  After informed consent was obtained and appropriate left hip was marked, he was brought to the operating room and sat up on a stretcher where spinal  anesthesia was obtained.  A Foley catheter was placed when he was laid supine.   We assessed his leg lengths and found his operative side is actually longer than his nonoperative side.  We knew that we needed to match that operatively so we do not lead to dislocation.  Traction boots were placed on both his feet, and he was placed  supine on the Hana fracture table with the perineal post in place and both legs in in-line skeletal traction device and no traction applied.  Due to him still feeling some pain with his hip, it was felt that the spinal anesthesia was late on setting.   General anesthesia was then obtained.  We then assessed his hip joint under direct fluoroscopy so we could get great pictures.  This was held for preoperative and intraoperative planning.  We then cleaned the hip with DuraPrep and sterile drapes.  A  time-out was called, and he was identified as correct patient, correct left hip.  We then made an incision just inferior and posterior to the anterior superior iliac spine and carried this obliquely down the leg.  We dissected down tensor fascia lata  muscle.  Tensor fascia was then divided longitudinally to proceed with a direct anterior approach to the hip.  We identified and cauterized circumflex vessels.  I then identified the hip capsule.  We opened up the hip capsule in an L-type format, finding  moderate joint effusion and significant periarticular osteophytes around the femoral head and neck.  We placed a Cobra retractor around the medial and lateral femoral neck and then made our femoral neck cut with an oscillating saw just proximal to  the  lesser trochanter and completed this with an osteotome.  We placed a corkscrew guide in the femoral head and removed the femoral head in its entirety and found a wide area devoid of cartilage.  I then placed a bent Hohmann over the medial acetabular rim  and removed remnants of the acetabular labrum and other debris.  We then began reaming  under direct visualization from a size 44 reamer in stepwise increments going up to a size 51 with all reamers under direct visualization, the last reamer under direct  fluoroscopy so we could obtain our depth of reaming, our inclination and anteversion.  I then placed the real DePuy Sector Gription acetabular component size 52 and a 36+4 neutral polyethylene liner based off his offset.  Attention was then turned to  the femur.  With the leg externally rotated to 120 degrees, extended and adducted, we were able to place a Mueller retractor medially and a Hohmann retractor behind the greater trochanter.  We released the lateral joint capsule and used a box-cutting  osteotome to enter the femoral canal and a rongeur to lateralize it.  We then began broaching using the ACTIS broaching system from the starter broach, then zero, all the way up to a size 8.  With the size 8 in place, we trialed a standard offset femoral  neck and a 36+1.5 hip ball.  We reduced this in the acetabulum.  We were pleased with his leg length, offset, range of motion and stability.  Intraoperatively, radiographically it looked like he was long, but we knew that he was long preoperatively and  we have matched his preoperative film with this intraoperative film.  We then dislocated the hip and removed the trial components.  We irrigated the hip with normal saline solution and then placed the real high-offset ACTIS femoral component size 8 with  a real 36+1.5 metal hip ball, reduced this in the acetabulum, and again we were pleased with the stability.  We then irrigated the soft tissue with normal saline solution using pulsatile lavage.  We closed the joint capsule with interrupted #1 Ethibond  suture, followed by running #1 Vicryl and tensor fascia, 0 Vicryl was used to close the deep tissue, 2-0 Vicryl was used to close the subcutaneous tissue, and 2-0 and staples were used to reapproximate the skin.  Xeroform and a well-padded sterile   dressing were applied.  He was taken off the Hana table, awakened, extubated, and taken to recovery room in stable condition.  All final counts were correct.  There were no complications noted.  Of note, Rexene Edison, PA-C, did assist during the entire  case, and his assistance was crucial for facilitating all aspects of this case.  LN/NUANCE  D:10/27/2018 T:10/27/2018 JOB:[id number]

## 2018-10-27 NOTE — H&P (Signed)
TOTAL HIP ADMISSION H&P  Patient is admitted for left total hip arthroplasty.  Subjective:  Chief Complaint: left hip pain  HPI: [name], 78 y.o. male, has a history of pain and functional disability in the left hip(s) due to arthritis and patient has failed non-surgical conservative treatments for greater than 12 weeks to include NSAID's and/or analgesics, corticosteriod injections, flexibility and strengthening excercises, use of assistive devices, weight reduction as appropriate and activity modification.  Onset of symptoms was gradual starting 5 years ago with gradually worsening course since that time.The patient noted no past surgery on the left hip(s).  Patient currently rates pain in the left hip at 10 out of 10 with activity. Patient has night pain, worsening of pain with activity and weight bearing, pain that interfers with activities of daily living and pain with passive range of motion. Patient has evidence of subchondral cysts, subchondral sclerosis, periarticular osteophytes and joint space narrowing by imaging studies. This condition presents safety issues increasing the risk of falls.  There is no current active infection.  Patient Active Problem List   Diagnosis Date Noted  . Unilateral primary osteoarthritis, left hip 10/27/2018  . 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 Primary osteoarthritis of right wrist 06/17/2015  . Trigger little finger of left hand 06/17/2015  . Arthritis 05/15/2014  . Carpal tunnel syndrome 06/03/2012  . Neuromuscular disorder (HCC) 02/15/2012  . 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 Biceps tendinitis 08/29/2011  . Complete rupture of left rotator cuff 08/29/2011  . Hyperlipidemia, mixed 05/03/2011  . 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 Arthritis   .  Barrett's esophagus   . BPH (benign prostatic hyperplasia)   . BPPV (benign paroxysmal positional vertigo)   . HBP (high blood pressure)   . Hyperlipidemia   . Prediabetes   . Presence of permanent cardiac pacemaker   . RBBB (right bundle branch block with left anterior fascicular block)   . Seborrheic keratosis   . Traumatic leg injury     Past Surgical History:  Procedure Laterality Date  . ESOPHAGOGASTRODUODENOSCOPY (EGD) WITH PROPOFOL N/A 03/27/2015   Procedure: ESOPHAGOGASTRODUODENOSCOPY (EGD) WITH PROPOFOL;  Surgeon: Wallace Cullens, MD;  Location: [facility] ENDOSCOPY;  Service: Gastroenterology;  Laterality: N/A;  . INSERT / REPLACE / REMOVE PACEMAKER    . SHOULDER SURGERY Bilateral   . TONSILLECTOMY      Current Facility-Administered Medications  Medication Dose Route Frequency Provider Last Rate Last Dose  . ceFAZolin (ANCEF) IVPB 2g/100 mL premix  2 g Intravenous On Call to OR Kirtland Bouchard, PA-C      . chlorhexidine (HIBICLENS) 4 % liquid 4 application  60 mL Topical Once Richardean Canal W, PA-C      . povidone-iodine 10 % swab 2 application  2 application Topical Once Richardean Canal W, PA-C      . tranexamic acid (CYKLOKAPRON) IVPB 1,000 mg  1,000 mg Intravenous To OR Kirtland Bouchard, PA-C       No Known Allergies  Social History   Tobacco Use  . Smoking status: Former Games developer  . Smokeless tobacco: Never Used  . Tobacco comment: quit 30years   Substance Use Topics  . Alcohol use: Yes    Comment: social    No family history on file.   Review of Systems  Musculoskeletal: Positive for joint pain.  All other systems reviewed and are negative.   Objective:  Physical Exam  Constitutional: He is oriented to person, place, and time. He appears well-developed and well-nourished.  HENT:  Head: Normocephalic and atraumatic.  Eyes: Pupils are equal, round, and reactive to light. EOM are normal.  Neck: Normal range of motion. Neck supple.  Cardiovascular: Normal rate.   Respiratory: Effort normal.  GI: Soft. Bowel sounds are normal.  Musculoskeletal:     Left hip: He exhibits decreased range of motion, decreased strength, tenderness and bony tenderness.  Neurological: He is alert and oriented to person, place, and time.  Skin: Skin is warm and dry.  Psychiatric: He has a normal mood and affect.    Vital signs in last 24 hours:    Labs:   Estimated body mass index is 30.14 kg/m as calculated from the following:   Height as of 10/23/18: 5\' 9"  (1.753 m).   Weight as of 10/23/18: 92.6 kg.   Imaging Review Plain radiographs demonstrate severe degenerative joint disease of the left hip(s). The bone quality appears to be good for age and reported activity level.      Assessment/Plan:  End stage arthritis, left hip(s)  The patient history, physical examination, clinical judgement of the provider and imaging studies are consistent with end stage degenerative joint disease of the left hip(s) and total hip arthroplasty is deemed medically necessary. The treatment options including medical management, injection therapy, arthroscopy and arthroplasty were discussed at length. The risks and benefits of total hip arthroplasty were presented and reviewed. The risks due to aseptic loosening, infection, stiffness, dislocation/subluxation,  thromboembolic complications and other imponderables were discussed.  The patient acknowledged the explanation, agreed to proceed with the plan and consent was signed. Patient is being admitted for inpatient treatment for surgery, pain control, PT, OT, prophylactic antibiotics, VTE prophylaxis, progressive ambulation and ADL's and discharge planning.The patient is planning to be discharged home with home health services

## 2018-10-27 NOTE — Anesthesia Postprocedure Evaluation (Signed)
Anesthesia Post Note  Patient: [name]  Procedure(s) Performed: LEFT TOTAL HIP ARTHROPLASTY ANTERIOR APPROACH (Left )     Patient location during evaluation: PACU Anesthesia Type: General Level of consciousness: awake and alert, awake and oriented Pain management: pain level controlled Vital Signs Assessment: post-procedure vital signs reviewed and stable Respiratory status: spontaneous breathing, nonlabored ventilation, respiratory function stable and patient connected to nasal cannula oxygen Cardiovascular status: blood pressure returned to baseline and stable Postop Assessment: no apparent nausea or vomiting Anesthetic complications: no Comments: Failed spinal, conversion to general anesthesia.    Last Vitals:  Vitals:   10/27/18 1530 10/27/18 1550  BP: (!) 143/83 134/81  Pulse: 64 67  Resp: 11 15  Temp: (!) 36 C 36.4 C  SpO2: 100% 99%    Last Pain:  Vitals:   10/27/18 1621  TempSrc:   PainSc: 7                  Cecile Hearing

## 2018-10-28 ENCOUNTER — Other Ambulatory Visit: Payer: Self-pay | Admitting: Orthopaedic Surgery

## 2018-10-28 DIAGNOSIS — M1612 Unilateral primary osteoarthritis, left hip: Secondary | ICD-10-CM | POA: Diagnosis not present

## 2018-10-28 LAB — CBC
HCT: 34.1 % — ABNORMAL LOW (ref 39.0–52.0)
Hemoglobin: 11.1 g/dL — ABNORMAL LOW (ref 13.0–17.0)
MCH: 32.5 pg (ref 26.0–34.0)
MCHC: 32.6 g/dL (ref 30.0–36.0)
MCV: 99.7 fL (ref 80.0–100.0)
Platelets: 157 10*3/uL (ref 150–400)
RBC: 3.42 MIL/uL — ABNORMAL LOW (ref 4.22–5.81)
RDW: 13.5 % (ref 11.5–15.5)
WBC: 13.3 10*3/uL — ABNORMAL HIGH (ref 4.0–10.5)
nRBC: 0 % (ref 0.0–0.2)

## 2018-10-28 LAB — BASIC METABOLIC PANEL
Anion gap: 7 (ref 5–15)
BUN: 15 mg/dL (ref 8–23)
CO2: 25 mmol/L (ref 22–32)
Calcium: 8.3 mg/dL — ABNORMAL LOW (ref 8.9–10.3)
Chloride: 104 mmol/L (ref 98–111)
Creatinine, Ser: 0.98 mg/dL (ref 0.61–1.24)
GFR calc Af Amer: >60 mL/min (ref >60)
GFR calc non Af Amer: >60 mL/min (ref >60)
Glucose, Bld: 128 mg/dL — ABNORMAL HIGH (ref 70–99)
Potassium: 4 mmol/L (ref 3.5–5.1)
Sodium: 136 mmol/L (ref 135–145)

## 2018-10-28 MED ORDER — OXYCODONE HCL 5 MG PO TABS: 5.0000 mg | ORAL_TABLET | Freq: Four times a day (QID) | ORAL | 0 refills | Status: AC | PRN

## 2018-10-28 MED ORDER — METHOCARBAMOL 500 MG PO TABS: 500.0000 mg | ORAL_TABLET | Freq: Four times a day (QID) | ORAL | 0 refills | Status: DC | PRN

## 2018-10-28 NOTE — Progress Notes (Signed)
Physical Therapy Treatment Patient Details Name: ROY TOKARZ MRN: [id number] DOB: 02/19/1941 Today's Date: 10/28/2018    History of Present Illness 78 yo male s/p L THA-direct anterior 10/27/18. Hx of bil rotator cuff repairs, RA    PT Comments    Progressing with mobility. Will plan to have a 2nd session prior to possible d/c home later today.    Follow Up Recommendations  Follow surgeon's recommendation for DC plan and follow-up therapies(HHPT)     Equipment Recommendations  None recommended by PT    Recommendations for Other Services       Precautions / Restrictions Precautions Precautions: Fall Restrictions Weight Bearing Restrictions: No Other Position/Activity Restrictions: WBAT    Mobility  Bed Mobility Overal bed mobility: Needs Assistance Bed Mobility: Supine to Sit     Supine to sit: Min assist     General bed mobility comments: small amount of assist or L LE. Cues for technique.   Transfers Overall transfer level: Needs assistance Equipment used: Rolling walker (2 wheeled) Transfers: Sit to/from Stand Sit to Stand: Min assist;From elevated surface         General transfer comment: Assist to rise, stabilize, control descent. VCs safety, technique, hand placement.   Ambulation/Gait Ambulation/Gait assistance: Min guard Gait Distance (Feet): 75 Feet Assistive device: Rolling walker (2 wheeled) Gait Pattern/deviations: Step-to pattern;Step-through pattern;Decreased stride length     General Gait Details: close guard for safety. VCS safety, sequence. Slow gait speed. Pt denied dizziness   Stairs             Wheelchair Mobility    Modified Rankin (Stroke Patients Only)       Balance                                            Cognition Arousal/Alertness: Awake/alert Behavior During Therapy: WFL for tasks assessed/performed Overall Cognitive Status: Within Functional Limits for tasks assessed                                         Exercises Total Joint Exercises Ankle Circles/Pumps: AROM;Both;10 reps;Seated Quad Sets: AROM;Both;10 reps;Seated Heel Slides: AAROM;Left;10 reps;Supine Hip ABduction/ADduction: AAROM;Left;10 reps;Seated Long Arc Quad: AROM;Left;10 reps;Seated    General Comments        Pertinent Vitals/Pain Pain Assessment: 0-10 Pain Score: 4  Pain Location: L thigh Pain Descriptors / Indicators: Discomfort;Sore Pain Intervention(s): Monitored during session;Repositioned;Ice applied    Home Living                      Prior Function            PT Goals (current goals can now be found in the care plan section) Progress towards PT goals: Progressing toward goals    Frequency    7X/week      PT Plan Current plan remains appropriate    Co-evaluation              AM-PAC PT "6 Clicks" Mobility   Outcome Measure  Help needed turning from your back to your side while in a flat bed without using bedrails?: A Little Help needed moving from lying on your back to sitting on the side of a flat bed without using bedrails?: A Little Help needed moving to and  from a bed to a chair (including a wheelchair)?: A Little Help needed standing up from a chair using your arms (e.g., wheelchair or bedside chair)?: A Little Help needed to walk in hospital room?: A Little Help needed climbing 3-5 steps with a railing? : A Little 6 Click Score: 18    End of Session Equipment Utilized During Treatment: Gait belt Activity Tolerance: Patient tolerated treatment well Patient left: in chair;with call bell/phone within reach   PT Visit Diagnosis: Other abnormalities of gait and mobility (R26.89)     Time: 0240-9735 PT Time Calculation (min) (ACUTE ONLY): 20 min  Charges:  $Gait Training: 8-22 mins                        Weston Anna, Adrian Pager: [phone] Office: [phone]

## 2018-10-28 NOTE — Progress Notes (Signed)
Patient ID: [name], male   DOB: 06/13/1940, 78 y.o.   MRN: [id number] The patient seems to be doing much better today than he was yesterday immediately after surgery when he was having severe pain.  His pain is under much better control.  He has been up with therapy today.  They plan on working with him again this afternoon.  We have resumed his Eliquis as well which she is on chronically for his A. fib.  His left hip is stable on exam today.  His incision is clean and dry.  His vital signs are stable.  His hemoglobin and hematocrit are stable.  We are comfortable with discharging him to home today.

## 2018-10-28 NOTE — Care Management CC44 (Signed)
Condition Code 44 Documentation Completed  Patient Details  Name: [name] MRN: [id number] Date of Birth: [birth year]   Condition Code 44 given:  Yes Patient signature on Condition Code 44 notice:  Yes Documentation of 2 MD's agreement:  Yes Code 44 added to claim:  Yes    Joaquin Courts, RN 10/28/2018, 10:36 AM

## 2018-10-28 NOTE — Progress Notes (Signed)
Nurse reviewed discharge instructions with pt.  Pt verbalized understanding of discharge instructions, follow up appointments and new medications.  Pt equipment delivered to pt's room prior to discharge.

## 2018-10-28 NOTE — Progress Notes (Signed)
    Home health agencies that serve [id number].        Home Health Agencies Search Results  Results List Table  Home Health Agency Information Quality of Patient Care Rating Patient Survey Summary Rating  [facility] [phone] 4 out of 5 stars 4 out of 5 stars  [facility] [phone] 3 out of 5 stars 5 out of 5 stars  [facility] [phone] 4  out of 5 stars 3 out of 5 stars  [facility] [phone] 4 out of 5 stars 4 out of 5 stars  [facility] [phone] 4 out of 5 stars 4 out of 5 stars  [facility] [phone] 3 out of 5 stars 3 out of 5 stars  [facility] [phone] 3 out of 5 stars 3 out of 5 stars  [facility] [place] [phone] 3  out of 5 stars 4 out of 5 stars  [facility] [phone] 3 out of 5 stars 4 out of 5 stars  [facility] [phone] 3  out of 5 stars 3 out of 5 stars  [facility] [phone] 3  out of 5 stars 4 out of 5 stars  [facility] [phone] 4  out of 5 stars 3 out of 5 stars  [facility] [phone] 4  out of 5 stars 2 out of 5 stars   Home Health Footnotes  Footnote number Footnote as displayed on Home Health Compare  1 This agency provides services under a federal waiver program to non-traditional, chronic long term population.  2 This agency provides services to a special needs population.  3 Not Available.  4 The number of patient episodes for this measure is too small to report.  5 This measure currently does not have data or provider has been certified/recertified for less than 6 months.  6 The national average for this measure is not provided because of state-to-state differences in data collection.  7 Medicare is not displaying rates for this measure for any home health agency, because of an issue with the data.  8  There were problems with the data and they are being corrected.  9 Zero, or very few, patients met the survey's rules for inclusion. The scores shown, if any, reflect a very small number of surveys and may not accurately tell how an agency is doing.  10 Survey results are based on less than 12 months of data.  11 Fewer than 70 patients completed the survey. Use the scores shown, if any, with caution as the number of surveys may be too low to accurately tell how an agency is doing.  12 No survey results are available for this period.  13 Data suppressed by CMS for one or more quarters.    

## 2018-10-28 NOTE — TOC Initial Note (Signed)
Transition of Care [facility]) - Initial/Assessment Note    Patient Details  Name: [name] MRN: [id number] Date of Birth: Sep 18, 1940  Transition of Care (TOC) CM/SW Contact:    Joaquin Courts, RN Phone Number: 10/28/2018, 12:23 PM  Clinical Narrative:   CM spoke with patient at bedside, patient reports he has a rolling walker at home. Spouse, [name], available to assist pt as needed. Patient selected and [facility] with [facility] for [place]. DME 3-in-1 arranged through [employer].                 Expected Discharge Plan: [place] Barriers to Discharge: Continued Medical Work up   Patient Goals and CMS Choice Patient states their goals for this hospitalization and ongoing recovery are:: to go home CMS Medicare.gov Compare Post Acute Care list provided to:: Patient Choice offered to / list presented to : Patient  Expected Discharge Plan and Services Expected Discharge Plan: [place]   Discharge Planning Services: CM Consult Post Acute Care Choice: Durable Medical Equipment, Home Health Living arrangements for the past 2 months: Single Family Home Expected Discharge Date: 10/28/18               DME Arranged: 3-N-1 DME Agency: [facility] Date DME Agency Contacted: 10/28/18 Time DME Agency Contacted: 1221 Representative spoke with at DME Agency: [place]: PT [place]: [facility] [employer]) Date [place]: 10/28/18 Time [place]: 1222 Representative spoke with at [place]: Graciella Freer  Prior Living Arrangements/Services Living arrangements for the past 2 months: [place] with:: Spouse Patient language and need for interpreter reviewed:: Yes Do you feel safe going back to the place where you live?: Yes      Need for Family Participation in Patient Care: Yes (Comment) Care giver support system in place?: Yes (comment)   Criminal Activity/Legal Involvement  Pertinent to Current Situation/Hospitalization: No - Comment as needed  Activities of Daily Living Home Assistive Devices/Equipment: Walker (specify type), Blood pressure cuff, Eyeglasses, Raised toilet seat with rails ADL Screening (condition at time of admission) Patient's cognitive ability adequate to safely complete daily activities?: Yes Is the patient deaf or have difficulty hearing?: No Does the patient have difficulty seeing, even when wearing glasses/contacts?: No Does the patient have difficulty concentrating, remembering, or making decisions?: No Patient able to express need for assistance with ADLs?: Yes Does the patient have difficulty dressing or bathing?: No Independently performs ADLs?: Yes (appropriate for developmental age) Does the patient have difficulty walking or climbing stairs?: No Weakness of Legs: None Weakness of Arms/Hands: None  Permission Sought/Granted Permission sought to share information with : Family Supports Permission granted to share information with : Yes, Verbal Permission Granted  Share Information with NAME: [name]     Permission granted to share info w Relationship: spouse     Emotional Assessment Appearance:: Appears stated age Attitude/Demeanor/Rapport: Engaged Affect (typically observed): Accepting Orientation: : Oriented to Self, Oriented to Place, Oriented to  Time, Oriented to Situation   Psych Involvement: No (comment)  Admission diagnosis:  osteoarthritis left hip Patient Active Problem List   Diagnosis Date Noted  . Unilateral primary osteoarthritis, left hip 10/27/2018  . Status post total replacement of left hip 10/27/2018  . 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 Primary osteoarthritis of right wrist 06/17/2015  . Trigger little finger  of left hand 06/17/2015  . Arthritis 05/15/2014  . Carpal tunnel syndrome 06/03/2012  . Neuromuscular  disorder (HCC) 02/15/2012  . 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 Biceps tendinitis 08/29/2011  . Complete rupture of left rotator cuff 08/29/2011  . Hyperlipidemia, mixed 05/03/2011  . Bladder neck obstruction 04/05/2011   PCP:  Lauro Regulus, MD Pharmacy:   [facility] [phone] [place] Rough [facility] - [address] [place] [address] Phone: [phone] Fax: [phone]     Social Determinants of Health (SDOH) Interventions    Readmission Risk Interventions No flowsheet data found.

## 2018-10-28 NOTE — Plan of Care (Signed)
  Problem: Education: Goal: Knowledge of General Education information will improve Description Including pain rating scale, medication(s)/side effects and non-pharmacologic comfort measures Outcome: Progressing   Problem: Activity: Goal: Risk for activity intolerance will decrease Outcome: Progressing   Problem: Coping: Goal: Level of anxiety will decrease Outcome: Progressing   Problem: Activity: Goal: Ability to avoid complications of mobility impairment will improve Outcome: Progressing   Problem: Pain Management: Goal: Pain level will decrease with appropriate interventions Outcome: Progressing

## 2018-10-28 NOTE — Progress Notes (Signed)
Physical Therapy Treatment Patient Details Name: [name] MRN: [id number] DOB: 1940-08-18 Today's Date: 10/28/2018    History of Present Illness 78 yo male s/p L THA-direct anterior 10/27/18. Hx of bil rotator cuff repairs, RA    PT Comments    Progressing with mobility. Reviewed/practiced gait training and stair training. All education completed. Okay to d/c from PT standpoint-made RN aware.   Follow Up Recommendations  Follow surgeon's recommendation for DC plan and follow-up therapies(HHPT)     Equipment Recommendations  None recommended by PT    Recommendations for Other Services       Precautions / Restrictions Precautions Precautions: Fall Restrictions Weight Bearing Restrictions: No Other Position/Activity Restrictions: WBAT    Mobility  Bed Mobility Overal bed mobility: Needs Assistance Bed Mobility: Supine to Sit     Supine to sit: Min assist     General bed mobility comments: oob in recliner  Transfers Overall transfer level: Needs assistance Equipment used: Rolling walker (2 wheeled) Transfers: Sit to/from Stand Sit to Stand: Min guard         General transfer comment: Close guard for safety. VCs safety, hand placement.   Ambulation/Gait Ambulation/Gait assistance: Min guard Gait Distance (Feet): 60 Feet Assistive device: Rolling walker (2 wheeled) Gait Pattern/deviations: Step-to pattern     General Gait Details: close guard for safety. VCS safety, sequence. Slow gait speed   Stairs Stairs: Yes Min assist  Stair Management: Step to pattern;With walker;Forwards Number of Stairs: 2 General stair comments: VCs safety, technique, sequence. Assist to stabilize walker and pt.    Wheelchair Mobility    Modified Rankin (Stroke Patients Only)       Balance Overall balance assessment: Needs assistance         Standing balance support: Bilateral upper extremity supported Standing balance-Leahy Scale: Poor                              Cognition Arousal/Alertness: Awake/alert Behavior During Therapy: WFL for tasks assessed/performed Overall Cognitive Status: Within Functional Limits for tasks assessed                                        Exercises   General Comments        Pertinent Vitals/Pain Pain Assessment: 0-10 Pain Score: 5  Pain Location: L thigh Pain Descriptors / Indicators: Discomfort;Sore Pain Intervention(s): Monitored during session;Repositioned;Ice applied    Home Living                      Prior Function            PT Goals (current goals can now be found in the care plan section) Progress towards PT goals: Progressing toward goals    Frequency    7X/week      PT Plan Current plan remains appropriate    Co-evaluation              AM-PAC PT "6 Clicks" Mobility   Outcome Measure  Help needed turning from your back to your side while in a flat bed without using bedrails?: A Little Help needed moving from lying on your back to sitting on the side of a flat bed without using bedrails?: A Little Help needed moving to and from a bed to a chair (including a wheelchair)?: A Little Help needed standing  up from a chair using your arms (e.g., wheelchair or bedside chair)?: A Little Help needed to walk in hospital room?: A Little Help needed climbing 3-5 steps with a railing? : A Little 6 Click Score: 18    End of Session Equipment Utilized During Treatment: Gait belt Activity Tolerance: Patient tolerated treatment well Patient left: in chair;with call bell/phone within reach   PT Visit Diagnosis: Other abnormalities of gait and mobility (R26.89);Pain Pain - Right/Left: Left Pain - part of body: Hip     Time: 1130-1140 PT Time Calculation (min) (ACUTE ONLY): 10 min  Charges:  $Gait Training: 8-22 mins                       Weston Anna, PT Acute Rehabilitation Services Pager: [phone] Office: [phone]

## 2018-10-28 NOTE — Discharge Instructions (Signed)

## 2018-10-28 NOTE — Discharge Summary (Signed)
Patient ID: [name] MRN: [id number] DOB/AGE: August 13, 1940 78 y.o.  Admit date: 10/27/2018 Discharge date: 10/28/2018  Admission Diagnoses:  Principal Problem:   Unilateral primary osteoarthritis, left hip Active Problems:   Status post total replacement of left hip   Discharge Diagnoses:  Same  Past Medical History:  Diagnosis Date  . Arthritis   . Barrett's esophagus   . BPH (benign prostatic hyperplasia)   . BPPV (benign paroxysmal positional vertigo)   . HBP (high blood pressure)   . Hyperlipidemia   . Prediabetes   . Presence of permanent cardiac pacemaker   . RBBB (right bundle branch block with left anterior fascicular block)   . Seborrheic keratosis   . Traumatic leg injury     Surgeries: Procedure(s): LEFT TOTAL HIP ARTHROPLASTY ANTERIOR APPROACH on 10/27/2018   Consultants:   Discharged Condition: Improved  Hospital Course: [name] is an 78 y.o. male who was admitted 10/27/2018 for operative treatment ofUnilateral primary osteoarthritis, left hip. Patient has severe unremitting pain that affects sleep, daily activities, and work/hobbies. After pre-op clearance the patient was taken to the operating room on 10/27/2018 and underwent  Procedure(s): LEFT TOTAL HIP ARTHROPLASTY ANTERIOR APPROACH.    Patient was given perioperative antibiotics:  Anti-infectives (From admission, onward)   Start     Dose/Rate Route Frequency Ordered Stop   10/27/18 1800  ceFAZolin (ANCEF) IVPB 1 g/50 mL premix     1 g 100 mL/hr over 30 Minutes Intravenous Every 6 hours 10/27/18 1549 10/28/18 0033   10/27/18 0715  ceFAZolin (ANCEF) IVPB 2g/100 mL premix     2 g 200 mL/hr over 30 Minutes Intravenous On call to O.R. 10/27/18 8756 10/27/18 1125       Patient was given sequential compression devices, early ambulation, and chemoprophylaxis to prevent DVT.  Patient benefited maximally from hospital stay and there were no complications.    Recent vital signs:  Patient Vitals  for the past 24 hrs:  BP Temp Temp src Pulse Resp SpO2 Height Weight  10/28/18 0923 103/71 98.8 F (37.1 C) Oral (!) 58 16 96 % - -  10/28/18 0602 114/73 99.6 F (37.6 C) Oral 68 16 97 % - -  10/28/18 0128 120/83 (!) 100.4 F (38 C) Oral 79 16 97 % - -  10/27/18 2133 136/84 98.4 F (36.9 C) Oral 72 16 100 % - -  10/27/18 1743 (!) 141/83 98.2 F (36.8 C) Oral 87 17 100 % - -  10/27/18 1550 134/81 97.6 F (36.4 C) Oral 67 15 99 % 5\' 9"  (1.753 m) 92.6 kg  10/27/18 1530 (!) 143/83 (!) 96.8 F (36 C) - 64 11 100 % - -  10/27/18 1515 135/84 - - 61 11 100 % - -  10/27/18 1500 134/80 - - 63 18 100 % - -  10/27/18 1445 136/82 - - 62 13 98 % - -  10/27/18 1430 119/77 - - (!) 35 14 100 % - -  10/27/18 1415 (!) 144/86 - - (!) 59 16 100 % - -  10/27/18 1400 132/86 - - 62 16 100 % - -  10/27/18 1345 119/78 - - (!) 59 15 94 % - -  10/27/18 1330 116/78 - - (!) 59 11 99 % - -  10/27/18 1315 106/86 - - 61 19 100 % - -  10/27/18 1307 111/62 97.9 F (36.6 C) - - (!) 21 100 % - -     Recent laboratory studies:  Recent  Labs    10/28/18 0350  WBC 13.3*  HGB 11.1*  HCT 34.1*  PLT 157  NA 136  K 4.0  CL 104  CO2 25  BUN 15  CREATININE 0.98  GLUCOSE 128*  CALCIUM 8.3*     Discharge Medications:   Allergies as of 10/28/2018   No Known Allergies     Medication List    TAKE these medications   atorvastatin 40 MG tablet Commonly known as:  LIPITOR Take 40 mg by mouth daily.   Benefiber Powd Take 30 mLs by mouth daily. 2 tablespoons   diltiazem 180 MG 24 hr capsule Commonly known as:  DILACOR XR Take 180 mg by mouth 2 (two) times a day.   diphenhydrAMINE 25 MG tablet Commonly known as:  BENADRYL Take 25 mg by mouth at bedtime as needed for sleep.   Eliquis 5 MG Tabs tablet Generic drug:  apixaban Take 5 mg by mouth every 12 (twelve) hours.   finasteride 5 MG tablet Commonly known as:  PROSCAR Take 5 mg by mouth daily.   folic acid 800 MCG tablet Commonly known as:   FOLVITE Take 800 mcg by mouth daily.   methocarbamol 500 MG tablet Commonly known as:  ROBAXIN Take 1 tablet (500 mg total) by mouth every 6 (six) hours as needed for muscle spasms.   methotrexate 2.5 MG tablet Commonly known as:  RHEUMATREX Take 20 mg by mouth every Sunday.   omeprazole 20 MG capsule Commonly known as:  PRILOSEC Take 20 mg by mouth daily.   oxyCODONE 5 MG immediate release tablet Commonly known as:  Oxy IR/ROXICODONE Take 1-2 tablets (5-10 mg total) by mouth every 6 (six) hours as needed for moderate pain (pain score 4-6).   tamsulosin 0.4 MG Caps capsule Commonly known as:  FLOMAX Take 0.4 mg by mouth daily.   traZODone 50 MG tablet Commonly known as:  DESYREL Take 50 mg by mouth at bedtime as needed for sleep.   Vitamin D3 50 MCG (2000 UT) capsule Take 2,000 Units by mouth daily.            Durable Medical Equipment  (From admission, onward)         Start     Ordered   10/27/18 1550  DME Walker rolling  Once    Question:  Patient needs a walker to treat with the following condition  Answer:  Status post total replacement of left hip   10/27/18 1549   10/27/18 1550  DME 3 n 1  Once     06 /05/20 1549          Diagnostic Studies: Dg Pelvis Portable  Result Date: 10/27/2018 CLINICAL DATA:  Osteoarthritis of the left hip. EXAM: PORTABLE PELVIS 1-2 VIEWS COMPARISON:  Radiographs dated 09/26/2018 FINDINGS: AP portable view of the pelvis demonstrates the patient has undergone left total hip prosthesis. Components appear in excellent position in the AP projection. No fracture. Minimal joint space narrowing of the right hip. IMPRESSION: Interval left total hip replacement as described. Electronically Signed   By: 11/26/2018 M.D.   On: 10/27/2018 13:53   Dg C-arm 1-60 Min-no Report  Result Date: 10/27/2018 Fluoroscopy was utilized by the requesting physician.  No radiographic interpretation.   Dg Hip Operative Unilat W Or W/o Pelvis Left  Result  Date: 10/27/2018 CLINICAL DATA:  Left hip arthroplasty EXAM: OPERATIVE LEFT HIP (WITH PELVIS IF PERFORMED) 2 VIEWS TECHNIQUE: Fluoroscopic spot image(s) were submitted for interpretation post-operatively. COMPARISON:  None. FINDINGS: Left total hip arthroplasty without failure complication. Normal alignment. FLUOROSCOPY TIME:  19 seconds IMPRESSION: Interval left total hip arthroplasty. Electronically Signed   By: Elige Ko   On: 10/27/2018 12:57    Disposition: Discharge disposition: 01-Home or Self Care         Follow-up Information    Kathryne Hitch, MD. Schedule an appointment as soon as possible for a visit in 2 week(s).   Specialty:  Orthopedic Surgery Contact information: [address] [place] [address] [phone]            Signed: Kathryne Hitch 10/28/2018, 10:27 AM

## 2018-10-30 ENCOUNTER — Encounter (HOSPITAL_COMMUNITY): Payer: Self-pay | Admitting: Orthopaedic Surgery

## 2018-10-30 NOTE — Telephone Encounter (Signed)
Please advise 

## 2018-10-31 ENCOUNTER — Telehealth: Payer: Self-pay | Admitting: Orthopaedic Surgery

## 2018-10-31 NOTE — Telephone Encounter (Signed)
Wes Physical Therapist with [facility] called in said he would like continuation orders for physical therapy for 2 times a week for 1 week and 3 times a week for 2 weeks.  [phone]

## 2018-11-01 NOTE — Telephone Encounter (Signed)
Verbal order left on VM  

## 2018-11-09 ENCOUNTER — Other Ambulatory Visit: Payer: Self-pay

## 2018-11-09 ENCOUNTER — Encounter: Payer: Self-pay | Admitting: Physician Assistant

## 2018-11-09 ENCOUNTER — Ambulatory Visit (INDEPENDENT_AMBULATORY_CARE_PROVIDER_SITE_OTHER): Payer: Medicare Other | Admitting: Physician Assistant

## 2018-11-09 DIAGNOSIS — Z96642 Presence of left artificial hip joint: Secondary | ICD-10-CM

## 2018-11-09 MED ORDER — GABAPENTIN 100 MG PO CAPS: 300.0000 mg | ORAL_CAPSULE | Freq: Every day | ORAL | 1 refills | Status: AC

## 2018-11-09 MED ORDER — TIZANIDINE HCL 2 MG PO CAPS: 2.0000 mg | ORAL_CAPSULE | Freq: Three times a day (TID) | ORAL | 0 refills | Status: AC

## 2018-11-09 NOTE — Progress Notes (Signed)
HPI: [name] returns today 2-week status post left total hip arthroplasty.  He is overall doing well.  He like to change his muscle relaxant to Zanaflex.  He is also asking for some gabapentin due to some burning sensation when ambulating.  He states that the pain is within the hip.  He has no burning sensation in the knee.  Seated.  Denies any fevers chills shortness of breath.  He is on chronic Eliquis.  Physical exam: Left hip staples well approximate the incision.  There is no signs of infection.  Thighs supple and nontender.  Left calf supple nontender.  Dorsiflexion plantarflexion ankle intact.  Ambulates with a cane with slight antalgic gait.  Good range of motion of the knee left hip with external rotation slightly limited internal rotation.   Impression: Status post left total hip arthroplasty 10/27/2018  Plan: Staples removed Steri-Strips applied.  He will work on scar tissue mobilization.  We will call in some Zanaflex and gabapentin for him.  He will stop taking his Robaxin.  See him back in 1 month sooner if there is any questions concerns.  Advised him he is able to get the incision wet but not submerge the incision.

## 2018-12-11 ENCOUNTER — Ambulatory Visit (INDEPENDENT_AMBULATORY_CARE_PROVIDER_SITE_OTHER): Payer: Medicare Other | Admitting: Physician Assistant

## 2018-12-11 ENCOUNTER — Encounter: Payer: Self-pay | Admitting: Physician Assistant

## 2018-12-11 ENCOUNTER — Other Ambulatory Visit: Payer: Self-pay

## 2018-12-11 VITALS — Ht 69.0 in | Wt 199.0 lb

## 2018-12-11 DIAGNOSIS — Z96642 Presence of left artificial hip joint: Secondary | ICD-10-CM

## 2018-12-11 NOTE — Progress Notes (Signed)
HPI: [name] comes in today status post left total hip arthroplasty 10/27/2018.  He states overall that his hip is doing well.  He has some stiffness when he first gets up.  Some soreness about the incision.  States incision otherwise is healing well no concerns.  Physical exam: Left hip good range of motion without pain.  Left calf supple nontender.  Ambulates without any assistive device.  Impression: Status post left total hip arthroplasty 10/27/2018  Plan: He will return for follow-up in June 2021 sooner if there is any questions or concerns.  Return in 1 year postop we will obtain an AP pelvis and lateral view of the left hip.  Questions were encouraged and answered.

## 2019-07-09 ENCOUNTER — Telehealth: Payer: Self-pay | Admitting: Orthopaedic Surgery

## 2019-07-09 NOTE — Telephone Encounter (Signed)
Emailed to patient per his request  [email or web address]

## 2019-07-09 NOTE — Telephone Encounter (Signed)
Patient called and stated that he has dental appt and has had a hip replacement and needs an antibiotic.  His dental appt is in 2 weeks. Please call patient to advise.  [phone]

## 2019-09-27 ENCOUNTER — Telehealth: Payer: Self-pay | Admitting: Anesthesiology

## 2019-09-27 NOTE — Telephone Encounter (Signed)
Patient called stating he thinks he has pulled or done something to his back. Wanted to know if Dr. Pernell Dupre knows any pain physicians he could get in with. He has moved to [place] and does not feel he can ride back to [place]. Please contact Dr. Pernell Dupre and see what he suggest.

## 2019-09-27 NOTE — Telephone Encounter (Signed)
Patient advised to go to Urgent Care or have PCP refer to a pain management specialist.

## 2019-12-10 ENCOUNTER — Ambulatory Visit: Payer: Medicare Other | Admitting: Physician Assistant

## 2022-01-11 ENCOUNTER — Ambulatory Visit: Payer: Medicare Other | Admitting: Anesthesiology

## 2022-04-05 ENCOUNTER — Ambulatory Visit (INDEPENDENT_AMBULATORY_CARE_PROVIDER_SITE_OTHER): Payer: Medicare Other | Admitting: Podiatry

## 2022-04-05 DIAGNOSIS — L600 Ingrowing nail: Secondary | ICD-10-CM | POA: Diagnosis not present

## 2022-04-05 MED ORDER — NEOMYCIN-POLYMYXIN-HC 3.5-10000-1 OT SUSP: OTIC | 0 refills | Status: DC

## 2022-04-05 MED ORDER — NEOMYCIN-POLYMYXIN-HC 3.5-10000-1 OT SUSP: OTIC | 0 refills | Status: AC

## 2022-04-05 NOTE — Patient Instructions (Addendum)

## 2022-04-06 ENCOUNTER — Telehealth: Payer: Self-pay | Admitting: *Deleted

## 2022-04-06 NOTE — Telephone Encounter (Signed)
error 

## 2022-04-06 NOTE — Progress Notes (Signed)
  Subjective:  Patient ID: [name], male    DOB: [birth year],  MRN: [id number]  Chief Complaint  Patient presents with   Nail Problem    NP - L Great toe ingrown    81 y.o. male presents with the above complaint. History confirmed with patient.  Its been painful for couple weeks, he thinks it started after a pedicure recently  Objective:  Physical Exam: warm, good capillary refill, no trophic changes or ulcerative lesions, normal DP and PT pulses, normal sensory exam, and ingrown nail left hallux medial border.  Assessment:   1. Ingrowing left great toenail      Plan:  Patient was evaluated and treated and all questions answered.     Ingrown Nail, left -Patient elects to proceed with minor surgery to remove ingrown toenail today. Consent reviewed and signed by patient. -Ingrown nail excised. See procedure note. -Educated on post-procedure care including soaking. Written instructions provided and reviewed. -Rx for Cortisporin sent to pharmacy. -Advised on signs and symptoms of infection developing.  We discussed that the phenol likely will create some redness and edema and tenderness around the nailbed as long as it is localized this is to be expected.  Will return as needed if any infection signs develop  Procedure: Excision of Ingrown Toenail Location: Left 1st toe medial nail borders. Anesthesia: Lidocaine 1% plain; 1.5 mL and Marcaine 0.5% plain; 1.5 mL, digital block. Skin Prep: Betadine. Dressing: Silvadene; telfa; dry, sterile, compression dressing. Technique: Following skin prep, the toe was exsanguinated and a tourniquet was secured at the base of the toe. The affected nail border was freed, split with a nail splitter, and excised. Chemical matrixectomy was then performed with phenol and irrigated out with alcohol. The tourniquet was then removed and sterile dressing applied. Disposition: Patient tolerated procedure well.    Return if symptoms worsen or fail  to improve.

## 2022-04-07 ENCOUNTER — Ambulatory Visit: Payer: Medicare Other | Admitting: Podiatry

## 2022-09-28 ENCOUNTER — Encounter: Payer: Self-pay | Admitting: Anesthesiology

## 2022-09-28 ENCOUNTER — Ambulatory Visit: Payer: Medicare Other | Attending: Anesthesiology | Admitting: Anesthesiology

## 2022-09-28 VITALS — BP 112/82 | HR 88 | Temp 98.0°F | Resp 16 | Ht 69.0 in | Wt 215.0 lb

## 2022-09-28 DIAGNOSIS — M0609 Rheumatoid arthritis without rheumatoid factor, multiple sites: Secondary | ICD-10-CM

## 2022-09-28 DIAGNOSIS — M7502 Adhesive capsulitis of left shoulder: Secondary | ICD-10-CM | POA: Diagnosis not present

## 2022-09-28 DIAGNOSIS — M1612 Unilateral primary osteoarthritis, left hip: Secondary | ICD-10-CM | POA: Insufficient documentation

## 2022-09-28 DIAGNOSIS — M47817 Spondylosis without myelopathy or radiculopathy, lumbosacral region: Secondary | ICD-10-CM | POA: Diagnosis not present

## 2022-09-28 MED ORDER — TRAMADOL HCL 50 MG PO TABS: 50.0000 mg | ORAL_TABLET | Freq: Three times a day (TID) | ORAL | 0 refills | Status: AC

## 2022-09-28 NOTE — Patient Instructions (Signed)

## 2022-09-29 NOTE — Progress Notes (Signed)
Subjective:  Patient ID: [name], male    DOB: 1940-09-30  Age: 82 y.o. MRN: [id number]  CC: Back Pain (lower)   Service Provided on Last Visit: Evaluation PROCEDURE: None  HPI [name] presents for a new patient evaluation.  He has previously been seen in the pain clinic several years ago.  Ray presents today with complaints of ongoing but diminished intensity lower back pain.  He describes an event about 4 to 5 weeks ago with a crescendo back pain centralized in nature with radiation into the hip buttock region.  The pain was intense for a 2 to 3-week period and has gradually diminished in intensity described as an aching gnawing and occasionally lancinating pain.  No change in lower extremity strength function bowel or bladder function was noted.  He denies any sciatica symptoms.  He was taking some anti-inflammatories for this but is reticent to do so because of his renal function.  He has tried some stretching exercises but is unfamiliar with any stretches that may help with this.  He is also been less active recently and he attributes some of this pain to that.  History [name] has a past medical history of Arthritis, Barrett's esophagus, BPH (benign prostatic hyperplasia), BPPV (benign paroxysmal positional vertigo), HBP (high blood pressure), Hyperlipidemia, Prediabetes, Presence of permanent cardiac pacemaker, RBBB (right bundle branch block with left anterior fascicular block), Seborrheic keratosis, and Traumatic leg injury.   He has a past surgical history that includes Shoulder surgery (Bilateral); Esophagogastroduodenoscopy (egd) with propofol (N/A, 03/27/2015); Insert / replace / remove pacemaker; Tonsillectomy; and Total hip arthroplasty (Left, 10/27/2018).   His family history is not on file.He reports that he has quit smoking. He has never used smokeless tobacco. He reports current alcohol use. He reports that he does not use drugs.  No valid procedures  specified.  No results found for: "TOXASSSELUR"  Outpatient Medications Prior to Visit  Medication Sig Dispense Refill   atorvastatin (LIPITOR) 40 MG tablet Take 40 mg by mouth daily.     Cholecalciferol (VITAMIN D3) 2000 units capsule Take 2,000 Units by mouth daily.     ELIQUIS 5 MG TABS tablet Take 5 mg by mouth every 12 (twelve) hours.  0   finasteride (PROSCAR) 5 MG tablet Take 5 mg by mouth daily.     folic acid (FOLVITE) 800 MCG tablet Take 800 mcg by mouth daily.      gabapentin (NEURONTIN) 100 MG capsule Take 3 capsules (300 mg total) by mouth at bedtime. 30 capsule 1   methotrexate (RHEUMATREX) 2.5 MG tablet Take 20 mg by mouth every Sunday.      metoprolol succinate (TOPROL-XL) 50 MG 24 hr tablet Take 50 mg by mouth daily.     neomycin-polymyxin-hydrocortisone (CORTISPORIN) 3.5-10000-1 OTIC suspension Apply 1-2 drops daily after soaking and cover with bandaid 10 mL 0   omeprazole (PRILOSEC) 20 MG capsule Take 20 mg by mouth daily.      tamsulosin (FLOMAX) 0.4 MG CAPS capsule Take 0.4 mg by mouth daily.      traZODone (DESYREL) 50 MG tablet Take 50 mg by mouth at bedtime as needed for sleep.      Wheat Dextrin (BENEFIBER) POWD Take 30 mLs by mouth daily. 2 tablespoons     cyclobenzaprine (FLEXERIL) 5 MG tablet Please specify directions, refills and quantity 30 tablet 0   diltiazem (DILACOR XR) 180 MG 24 hr capsule Take 180 mg by mouth 2 (two) times a day.  diphenhydrAMINE (BENADRYL) 25 MG tablet Take 25 mg by mouth at bedtime as needed for sleep.      oxyCODONE (OXY IR/ROXICODONE) 5 MG immediate release tablet Take 1-2 tablets (5-10 mg total) by mouth every 6 (six) hours as needed for moderate pain (pain score 4-6). 30 tablet 0   tizanidine (ZANAFLEX) 2 MG capsule Take 1 capsule (2 mg total) by mouth 3 (three) times daily. 40 capsule 0   No facility-administered medications prior to visit.   Lab Results  Component Value Date   WBC 13.3 (H) 10/28/2018   HGB 11.1 (L)  10/28/2018   HCT 34.1 (L) 10/28/2018   PLT 157 10/28/2018   GLUCOSE 128 (H) 10/28/2018   NA 136 10/28/2018   K 4.0 10/28/2018   CL 104 10/28/2018   CREATININE 0.98 10/28/2018   BUN 15 10/28/2018   CO2 25 10/28/2018   HGBA1C 5.5 10/23/2018    --------------------------------------------------------------------------------------------------------------------- DG Pelvis Portable  Result Date: 10/27/2018 CLINICAL DATA:  Osteoarthritis of the left hip. EXAM: PORTABLE PELVIS 1-2 VIEWS COMPARISON:  Radiographs dated 09/26/2018 FINDINGS: AP portable view of the pelvis demonstrates the patient has undergone left total hip prosthesis. Components appear in excellent position in the AP projection. No fracture. Minimal joint space narrowing of the right hip. IMPRESSION: Interval left total hip replacement as described. Electronically Signed   By: Francene Boyers M.D.   On: 10/27/2018 13:53   DG HIP OPERATIVE UNILAT W OR W/O PELVIS LEFT  Result Date: 10/27/2018 CLINICAL DATA:  Left hip arthroplasty EXAM: OPERATIVE LEFT HIP (WITH PELVIS IF PERFORMED) 2 VIEWS TECHNIQUE: Fluoroscopic spot image(s) were submitted for interpretation post-operatively. COMPARISON:  None. FINDINGS: Left total hip arthroplasty without failure complication. Normal alignment. FLUOROSCOPY TIME:  19 seconds IMPRESSION: Interval left total hip arthroplasty. Electronically Signed   By: Elige Ko   On: 10/27/2018 12:57   DG C-Arm 1-60 Min-No Report  Result Date: 10/27/2018 CLINICAL DATA:  Left hip arthroplasty EXAM: OPERATIVE LEFT HIP (WITH PELVIS IF PERFORMED) 2 VIEWS TECHNIQUE: Fluoroscopic spot image(s) were submitted for interpretation post-operatively. COMPARISON:  None. FINDINGS: Left total hip arthroplasty without failure complication. Normal alignment. FLUOROSCOPY TIME:  19 seconds IMPRESSION: Interval left total hip arthroplasty. Electronically Signed   By: Elige Ko   On: 10/27/2018 12:57        ---------------------------------------------------------------------------------------------------------------------- Past Medical History:  Diagnosis Date   Arthritis    Barrett's esophagus    BPH (benign prostatic hyperplasia)    BPPV (benign paroxysmal positional vertigo)    HBP (high blood pressure)    Hyperlipidemia    Prediabetes    Presence of permanent cardiac pacemaker    RBBB (right bundle branch block with left anterior fascicular block)    Seborrheic keratosis    Traumatic leg injury     Past Surgical History:  Procedure Laterality Date   ESOPHAGOGASTRODUODENOSCOPY (EGD) WITH PROPOFOL N/A 03/27/2015   Procedure: ESOPHAGOGASTRODUODENOSCOPY (EGD) WITH PROPOFOL;  Surgeon: Wallace Cullens, MD;  Location: [facility];  Service: Gastroenterology;  Laterality: N/A;   INSERT / REPLACE / REMOVE PACEMAKER     SHOULDER SURGERY Bilateral    TONSILLECTOMY     TOTAL HIP ARTHROPLASTY Left 10/27/2018   Procedure: LEFT TOTAL HIP ARTHROPLASTY ANTERIOR APPROACH;  Surgeon: Kathryne Hitch, MD;  Location: [facility];  Service: Orthopedics;  Laterality: Left;    History reviewed. No pertinent family history.  Social History   Tobacco Use   Smoking status: Former   Smokeless tobacco: Never   Tobacco comments:  quit 30years   Substance Use Topics   Alcohol use: Yes    Comment: social    ---------------------------------------------------------------------------------------------------------------------  Scheduled Meds: Continuous Infusions: PRN Meds:.   BP 112/82   Pulse 88   Temp 98 F (36.7 C) (Temporal)   Resp 16   Ht 5\' 9"  (1.753 m)   Wt 215 lb (97.5 kg)   SpO2 100%   BMI 31.75 kg/m    BP Readings from Last 3 Encounters:  09/28/22 112/82  10/28/18 103/71  10/23/18 (!) 170/84     Wt Readings from Last 3 Encounters:  09/28/22 215 lb (97.5 kg)  12/11/18 199 lb (90.3 kg)  10/27/18 204 lb 2 oz (92.6 kg)      ----------------------------------------------------------------------------------------------------------------------  ROS Review of Systems CNS: No headaches visual dysfunction Cardiac: No angina or palpitations Pulmonary: No shortness of breath or wheezing GI: No abdominal pain diarrhea or constipation GU: No dysuria or hesitancy Musculoskeletal: Lower back spasming and centralized low back pain with radiation to the hip buttocks but not down the legs.  Objective:  BP 112/82   Pulse 88   Temp 98 F (36.7 C) (Temporal)   Resp 16   Ht 5\' 9"  (1.753 m)   Wt 215 lb (97.5 kg)   SpO2 100%   BMI 31.75 kg/m   Physical Exam Ray is a pleasant 82 year old white male and a good historian.  He is alert oriented cooperative compliant Heart is regular rate and rhythm without murmur Lungs are clear to auscultation Inspection of the low back reveals some paraspinous muscle tenderness in the lumbar region but no overt trigger points.  He has some pain on extension worsened in the standing position with left and right lateral rotation.  This does reproduce his primary pain complaint.  He has good muscle tone and bulk to the lower extremities.  He goes from seated to standing without difficulty.  He has good balance.     Assessment & Plan:   Quinshawn was seen today for back pain.  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  We gone over the risks benefits of this medication for him.  Unfortunately he cannot take anti-inflammatories secondary to his kidney function but tramadol may be beneficial for any intermittent bouts of  his low back pain.  I do not feel that further interventional studies are indicated at this time though he may be a candidate for an MRI of his low back.  2. Unilateral primary osteoarthritis, left hip Continue stretching strengthening exercises.  3. Adhesive capsulitis of left shoulder As above  4. Facet arthritis of lumbosacral region Going to send him over for physical therapy for evaluation and treatment to see if he can get on a regimen for stretching strengthening and core strengthening for his low back.  Will schedule him for return to clinic in 1 to 2 months for reevaluation. - Ambulatory referral to Physical Therapy    ----------------------------------------------------------------------------------------------------------------------  I am having [name] "Ray" start on traMADol. I am also having him maintain his tamsulosin, diphenhydrAMINE, omeprazole, traZODone, methotrexate, Eliquis, atorvastatin, Vitamin  D3, finasteride, folic acid, diltiazem, Benefiber, oxyCODONE, cyclobenzaprine, tizanidine, gabapentin, metoprolol succinate, and neomycin-polymyxin-hydrocortisone.   Meds ordered this encounter  Medications   traMADol (ULTRAM) 50 MG tablet    Sig: Take 1 tablet (50 mg total) by mouth 3 (three) times daily.    Dispense:  90 tablet    Refill:  0       Follow-up: Return in about 1 month (around 10/29/2022) for evaluation, med refill.    Yevette Edwards, MD 9:46 AM  The [place] practitioner database for opioid medications on this patient has been reviewed by me and my staff   Greater than 50% of the total encounter time was spent in counseling and / or coordination of care.     This dictation was performed utilizing Conservation officer, historic buildings.  Please excuse any unintentional or mistaken typographical errors as a result.

## 2023-01-19 ENCOUNTER — Other Ambulatory Visit: Payer: Self-pay | Admitting: Internal Medicine

## 2023-01-19 DIAGNOSIS — R1084 Generalized abdominal pain: Secondary | ICD-10-CM

## 2023-01-21 ENCOUNTER — Ambulatory Visit
Admission: RE | Admit: 2023-01-21 | Discharge: 2023-01-21 | Disposition: A | Discharge status: Home / Self Care | Payer: Medicare Other | Source: Ambulatory Visit | Attending: Internal Medicine | Admitting: Internal Medicine

## 2023-01-21 DIAGNOSIS — R1084 Generalized abdominal pain: Secondary | ICD-10-CM

## 2023-01-21 MED ORDER — IOHEXOL 300 MG/ML  SOLN
100.0000 mL | Freq: Once | INTRAMUSCULAR | Status: AC | PRN
  Administered 2023-01-21: 100 mL via INTRAVENOUS

## 2023-05-04 ENCOUNTER — Ambulatory Visit (INDEPENDENT_AMBULATORY_CARE_PROVIDER_SITE_OTHER): Payer: Medicare Other | Admitting: Podiatry

## 2023-05-04 ENCOUNTER — Encounter: Payer: Self-pay | Admitting: Podiatry

## 2023-05-04 DIAGNOSIS — L600 Ingrowing nail: Secondary | ICD-10-CM

## 2023-05-04 NOTE — Progress Notes (Signed)
Subjective:   Patient ID: [name], male   DOB: 82 y.o.   MRN: [id number]   HPI Patient presented with a lot of pain in his left big toenail and states he did have a corner removed which did well but this is really been bothering him recently   ROS      Objective:  Physical Exam  Alert status intact no change health history of a very thickened left hallux nail painful dorsally across the entire nail bed     Assessment:  Pathology more related to the entire nail versus the border     Plan:  H&P reviewed at this point organ to try conservative procedure instead of permanent removal of the nail and I went ahead anesthetized 60 mg like Marcaine mixture sterile prep done using sterile instrumentation remove the distal two thirds of the nail smoothed down the remainder applied sterile dressing instructed on soaks reappoint as symptoms indicate may require permanent procedure in future

## 2023-07-11 DIAGNOSIS — I10 Essential (primary) hypertension: Secondary | ICD-10-CM | POA: Insufficient documentation

## 2023-07-11 DIAGNOSIS — Z950 Presence of cardiac pacemaker: Secondary | ICD-10-CM | POA: Insufficient documentation

## 2023-07-11 DIAGNOSIS — Z87891 Personal history of nicotine dependence: Secondary | ICD-10-CM | POA: Insufficient documentation

## 2023-07-11 NOTE — Progress Notes (Unsigned)
Cardiology Office Note  Date:  07/12/2023   ID:  DON TIU, DOB [birth year], MRN [id number]  PCP:  Lauro Regulus, MD   Chief Complaint  Patient presents with   New Patient (Initial Visit)    Ref by Dr. Dareen Piano to establish care paroxysmal A-fib. Patient would like to establish care as he doesn't want to travel to [facility] for his care. "Doing well."     HPI:  [name] is a 83 year old gentleman with past medical history of Hypertension Paroxysmal atrial fibrillation History of smoking Tachy-brady: Pacemaker placed 2019 Who presents by referral from Dr. Dareen Piano for paroxysmal atrial fibrillation, aortic atherosclerosis, hyperlipidemia  On discussion today, reports that he is followed by EP at [facility], Secondary to driving and transportation issues would like to move his care to [facility] Overall feels he is doing well Denies any paroxysmal tachycardia episodes  Reports having issues with low blood pressure Low Bp today 98 systolic over the weekend "Low for a while" Talked with PMD/Dr. Dareen Piano, stopped amldipine, 2 weeks later BP went up 160 systolic, went back on amlodipine  Pacemaker followed by [facility] Date of Implant: Feb 08, 2018  Estimated Battery Longevity: 11 months in Jan 2025 Atrial pacing: 94.61 % RV pacing: 97.89 %   CT scan: Images pulled up and reviewed Mild to moderate diffuse aorta atherosclerosis  On Lipitor 40 daily  Lab work reviewed total cholesterol 143 LDL 77 normal LFTs creatinine 1.0 BUN 14  EKG personally reviewed by myself on todays visit EKG Interpretation Date/Time:  Tuesday July 12 2023 10:55:02 EST Ventricular Rate:  65 PR Interval:  98 QRS Duration:  106 QT Interval:  442 QTC Calculation: 459 R Axis:   -39  Text Interpretation: AV dual-paced rhythm When compared with ECG of 09-Oct-2008 12:02, Electronic ventricular pacemaker has replaced Sinus rhythm Confirmed by Julien Nordmann [phone]) on 07/12/2023 11:03:49 AM     PMH:   has a past medical history of Arthritis, Barrett's esophagus, BPH (benign prostatic hyperplasia), BPPV (benign paroxysmal positional vertigo), HBP (high blood pressure), Hyperlipidemia, Prediabetes, Presence of permanent cardiac pacemaker, RBBB (right bundle branch block with left anterior fascicular block), Seborrheic keratosis, and Traumatic leg injury.  PSH:    Past Surgical History:  Procedure Laterality Date   ESOPHAGOGASTRODUODENOSCOPY (EGD) WITH PROPOFOL N/A 03/27/2015   Procedure: ESOPHAGOGASTRODUODENOSCOPY (EGD) WITH PROPOFOL;  Surgeon: Wallace Cullens, MD;  Location[facility];  Service: Gastroenterology;  Laterality: N/A;   INSERT / REPLACE / REMOVE PACEMAKER     SHOULDER SURGERY Bilateral    TONSILLECTOMY     TOTAL HIP ARTHROPLASTY Left 10/27/2018   Procedure: LEFT TOTAL HIP ARTHROPLASTY ANTERIOR APPROACH;  Surgeon: Kathryne Hitch, MD;  Location: [facility];  Service: Orthopedics;  Laterality: Left;    Current Outpatient Medications  Medication Sig Dispense Refill   amLODipine (NORVASC) 2.5 MG tablet Take 2.5 mg by mouth daily.     atorvastatin (LIPITOR) 40 MG tablet Take 40 mg by mouth daily.     Cholecalciferol (VITAMIN D3) 2000 units capsule Take 2,000 Units by mouth daily.     diphenhydrAMINE (BENADRYL) 25 MG tablet Take 25 mg by mouth at bedtime as needed for sleep.      ELIQUIS 5 MG TABS tablet Take 5 mg by mouth every 12 (twelve) hours.  0   eplerenone (INSPRA) 25 MG tablet Take 1 tablet by mouth daily.     finasteride (PROSCAR) 5 MG tablet Take 5 mg by mouth daily.  folic acid (FOLVITE) 800 MCG tablet Take 800 mcg by mouth daily.      gabapentin (NEURONTIN) 100 MG capsule Take 3 capsules (300 mg total) by mouth at bedtime. 30 capsule 1   losartan (COZAAR) 100 MG tablet Take 1 tablet by mouth daily.     Magnesium Chloride-Calcium (SLOW MAGNESIUM/CALCIUM PO) Take 420 mg by mouth daily.     methotrexate (RHEUMATREX) 2.5 MG tablet Take 20 mg by mouth every  Sunday.      metoprolol succinate (TOPROL-XL) 100 MG 24 hr tablet Take 100 mg by mouth daily.     nystatin ointment (MYCOSTATIN) Apply 1 Application topically 2 (two) times daily as needed.     omeprazole (PRILOSEC) 20 MG capsule Take 20 mg by mouth daily.      tamsulosin (FLOMAX) 0.4 MG CAPS capsule Take 0.4 mg by mouth daily.      traZODone (DESYREL) 50 MG tablet Take 50 mg by mouth at bedtime as needed for sleep.      triamcinolone cream (KENALOG) 0.1 % Apply 1 Application topically as needed.     Wheat Dextrin (BENEFIBER) POWD Take 30 mLs by mouth daily. 2 tablespoons     cyclobenzaprine (FLEXERIL) 5 MG tablet Please specify directions, refills and quantity (Patient not taking: Reported on 07/12/2023) 30 tablet 0   neomycin-polymyxin-hydrocortisone (CORTISPORIN) 3.5-10000-1 OTIC suspension Apply 1-2 drops daily after soaking and cover with bandaid (Patient not taking: Reported on 07/12/2023) 10 mL 0   oxyCODONE (OXY IR/ROXICODONE) 5 MG immediate release tablet Take 1-2 tablets (5-10 mg total) by mouth every 6 (six) hours as needed for moderate pain (pain score 4-6). (Patient not taking: Reported on 07/12/2023) 30 tablet 0   tizanidine (ZANAFLEX) 2 MG capsule Take 1 capsule (2 mg total) by mouth 3 (three) times daily. (Patient not taking: Reported on 07/12/2023) 40 capsule 0   No current facility-administered medications for this visit.     Allergies:   Spironolactone   Social History:  The patient  reports that he has quit smoking. He has never used smokeless tobacco. He reports current alcohol use. He reports that he does not use drugs.   Family History:   family history includes Autoimmune disease in his sister; Heart attack in his father and mother.    Review of Systems: Review of Systems  Constitutional: Negative.   HENT: Negative.    Respiratory: Negative.    Cardiovascular: Negative.   Gastrointestinal: Negative.   Musculoskeletal: Negative.   Neurological: Negative.    Psychiatric/Behavioral: Negative.    All other systems reviewed and are negative.    PHYSICAL EXAM: VS:  BP 100/60 (BP Location: Right Arm, Patient Position: Sitting, Cuff Size: Normal)   Pulse 65   Ht 5\' 9"  (1.753 m)   Wt 215 lb (97.5 kg)   SpO2 98%   BMI 31.75 kg/m  , BMI Body mass index is 31.75 kg/m. GEN: Well nourished, well developed, in no acute distress HEENT: normal Neck: no JVD, carotid bruits, or masses Cardiac: RRR; no murmurs, rubs, or gallops,no edema  Respiratory:  clear to auscultation bilaterally, normal work of breathing GI: soft, nontender, nondistended, + BS MS: no deformity or atrophy Skin: warm and dry, no rash Neuro:  Strength and sensation are intact Psych: euthymic mood, full affect  Recent Labs: No results found for requested labs within last 365 days.    Lipid Panel No results found for: "CHOL", "HDL", "LDLCALC", "TRIG"    Wt Readings from Last 3 Encounters:  07/12/23  215 lb (97.5 kg)  09/28/22 215 lb (97.5 kg)  12/11/18 199 lb (90.3 kg)     ASSESSMENT AND PLAN:  Problem List Items Addressed This Visit       Cardiology Problems   Essential hypertension   Relevant Medications   amLODipine (NORVASC) 2.5 MG tablet   metoprolol succinate (TOPROL-XL) 100 MG 24 hr tablet   eplerenone (INSPRA) 25 MG tablet   losartan (COZAAR) 100 MG tablet   Other Relevant Orders   EKG 12-Lead (Completed)   Hyperlipidemia, mixed   Relevant Medications   amLODipine (NORVASC) 2.5 MG tablet   metoprolol succinate (TOPROL-XL) 100 MG 24 hr tablet   eplerenone (INSPRA) 25 MG tablet   losartan (COZAAR) 100 MG tablet     Other   History of smoking   Pacemaker   Relevant Orders   EKG 12-Lead (Completed)   Other Visit Diagnoses       Paroxysmal atrial fibrillation (HCC)    -  Primary   Relevant Medications   amLODipine (NORVASC) 2.5 MG tablet   metoprolol succinate (TOPROL-XL) 100 MG 24 hr tablet   eplerenone (INSPRA) 25 MG tablet   losartan  (COZAAR) 100 MG tablet   Other Relevant Orders   EKG 12-Lead (Completed)      Paroxysmal atrial fibrillation Reports rhythm relatively well-controlled on metoprolol succinate 100 mg daily Tolerating Eliquis 5 twice daily No bleeding, no falls  Sick sinus syndrome, tachybradycardia syndrome Followed by EP at [facility], pacemaker placed 2019 for bradycardia Prefers to have device monitored in [place] to minimize driving  Hyperlipidemia Mild to moderate aortic atherosclerosis on CT scan, images pulled up and reviewed today Cholesterol close to goal, continue Lipitor 40 daily  Hypertension Blood pressure actually running low recently Reports blood pressure ran high by holding pain 2.5, had a 160 systolic noted after 2 weeks of holding amlodipine Recommend he try cutting losartan down to 50 daily, continue amlodipine, eplerenone, metoprolol succinate 100 mg  Pacer Referral placed for EP On January download reportedly had 11 months until ERI   Signed, [name], M.D., Ph.D. [facility] [place], [place] [phone]

## 2023-07-12 ENCOUNTER — Ambulatory Visit: Payer: Medicare Other | Attending: Cardiovascular Disease | Admitting: Cardiovascular Disease

## 2023-07-12 ENCOUNTER — Encounter: Payer: Self-pay | Admitting: Cardiovascular Disease

## 2023-07-12 VITALS — BP 100/60 | HR 65 | Ht 69.0 in | Wt 215.0 lb

## 2023-07-12 DIAGNOSIS — I480 Paroxysmal atrial fibrillation: Secondary | ICD-10-CM | POA: Diagnosis not present

## 2023-07-12 DIAGNOSIS — Z87891 Personal history of nicotine dependence: Secondary | ICD-10-CM | POA: Diagnosis not present

## 2023-07-12 DIAGNOSIS — I10 Essential (primary) hypertension: Secondary | ICD-10-CM | POA: Diagnosis present

## 2023-07-12 DIAGNOSIS — E782 Mixed hyperlipidemia: Secondary | ICD-10-CM | POA: Insufficient documentation

## 2023-07-12 DIAGNOSIS — Z950 Presence of cardiac pacemaker: Secondary | ICD-10-CM | POA: Diagnosis present

## 2023-07-12 NOTE — Patient Instructions (Addendum)
Referral to EP in [place], for pacer   Medication Instructions:  No changes  Ok to cut the losartan in 1/2 daily (50 mg) for low pressure  If you need a refill on your cardiac medications before your next appointment, please call your pharmacy.   Lab work: No new labs needed  Testing/Procedures: No new testing needed  Follow-Up: At [facility], you and your health needs are our priority.  As part of our continuing mission to provide you with exceptional heart care, we have created designated Provider Care Teams.  These Care Teams include your primary Cardiologist (physician) and Advanced Practice Providers (APPs -  Physician Assistants and Nurse Practitioners) who all work together to provide you with the care you need, when you need it.  You will need a follow up appointment in 6 months  Providers on your designated Care Team:   Nicolasa Ducking, NP Eula Listen, PA-C Cadence Fransico [place], [place]  COVID-19 Vaccine Information can be found at: [email or web address] For questions related to vaccine distribution or appointments, please email [email or web address] or call [phone].

## 2023-07-20 ENCOUNTER — Ambulatory Visit (INDEPENDENT_AMBULATORY_CARE_PROVIDER_SITE_OTHER): Payer: Medicare Other | Admitting: Orthopaedic Surgery

## 2023-07-20 ENCOUNTER — Encounter: Payer: Self-pay | Admitting: Orthopaedic Surgery

## 2023-07-20 ENCOUNTER — Other Ambulatory Visit (INDEPENDENT_AMBULATORY_CARE_PROVIDER_SITE_OTHER): Payer: Self-pay

## 2023-07-20 DIAGNOSIS — M25551 Pain in right hip: Secondary | ICD-10-CM

## 2023-07-20 MED ORDER — METHYLPREDNISOLONE 4 MG PO TABS: ORAL_TABLET | ORAL | 0 refills | Status: AC

## 2023-07-20 NOTE — Progress Notes (Signed)
 The patient is a 83 year old gentleman well-known to me.  He is 4 years and 8 months out from a left total hip replacement.  He is active 83 year old gentleman who was started to have significant right hip pain recently.  However that is now subsided and he was able to get on the elliptical easily today.  However it was pain in the groin he was concerned about his right hip.  He does have a slight limp when he walks today.  His left operative hip moves smoothly and fluidly with no blocks to rotation.  His right hip also moves smoothly and fluidly.  Standing AP pelvis and lateral of the right hip is seen today and compared to x-rays from 4 years ago.  His joint space is still well-maintained.  He did have a CT scan of his abdomen and pelvis in August of last year that I reviewed in terms of looking at the bone windows of his hips.  There are some mild arthritic changes in the right hip.  Since he is asymptomatic now, I would recommend just observing this.  I did send in a steroid taper for him to take if he does have a flareup of pain.  My next step would also be recommending an intra-articular steroid injection under ultrasound or fluoroscopy if things cleared up.  If it does he will call and let us know.  All questions and concerns were answered and addressed.  Follow-up otherwise as as needed.

## 2023-07-26 ENCOUNTER — Encounter: Payer: Self-pay | Admitting: Cardiology

## 2023-07-26 ENCOUNTER — Ambulatory Visit: Payer: Medicare Other | Attending: Cardiology | Admitting: Cardiology

## 2023-07-26 VITALS — BP 110/68 | HR 68 | Ht 69.0 in | Wt 215.0 lb

## 2023-07-26 DIAGNOSIS — I10 Essential (primary) hypertension: Secondary | ICD-10-CM

## 2023-07-26 DIAGNOSIS — Z950 Presence of cardiac pacemaker: Secondary | ICD-10-CM | POA: Diagnosis not present

## 2023-07-26 DIAGNOSIS — D6869 Other thrombophilia: Secondary | ICD-10-CM | POA: Diagnosis present

## 2023-07-26 DIAGNOSIS — I440 Atrioventricular block, first degree: Secondary | ICD-10-CM | POA: Diagnosis present

## 2023-07-26 DIAGNOSIS — I480 Paroxysmal atrial fibrillation: Secondary | ICD-10-CM

## 2023-07-26 DIAGNOSIS — I495 Sick sinus syndrome: Secondary | ICD-10-CM

## 2023-07-26 NOTE — Patient Instructions (Signed)
 Medication Instructions:  Your physician recommends that you continue on your current medications as directed. Please refer to the Current Medication list given to you today.  *If you need a refill on your cardiac medications before your next appointment, please call your pharmacy*  Follow-Up: At [facility], you and your health needs are our priority.  As part of our continuing mission to provide you with exceptional heart care, we have created designated Provider Care Teams.  These Care Teams include your primary Cardiologist (physician) and Advanced Practice Providers (APPs -  Physician Assistants and Nurse Practitioners) who all work together to provide you with the care you need, when you need it.  Your next appointment:   1 year  Provider:   You may see Nobie Putnam, MD or one of the following Advanced Practice Providers on your designated Care Team:   Francis Dowse, [place] [address] [place], [place] Sherie Don, NP Canary Brim, NP

## 2023-07-26 NOTE — Progress Notes (Signed)
 Electrophysiology Office Note:   Date:  07/26/2023  ID:  [name], DOB 09/21/1940, MRN [id number]  Primary Cardiologist: None Electrophysiologist: Nobie Putnam, MD      History of Present Illness:   [name] is a 83 y.o. male with h/o HTN, paroxysmal atrial fibrillation, tachycardia-bradycardia syndrome s/p pacemaker 2019, hyperlipidemia, tobacco use who is being seen today to establish care for his pacemaker.   Discussed the use of AI scribe software for clinical note transcription with the patient, who gave verbal consent to proceed.  History of Present Illness   The patient has been doing relatively well. He has no new or acute complaints today. He expresses concern about the short battery life of his pacemaker and estimated need for generator change after only 6-7 years. The patient is also on metoprolol and Eliquis for atrial fibrillation, and eplerenone for an unspecified condition. he tolerates these medications well. His BP has decreased over time, which has been managed by recently reducing the dose of losartan.     Review of systems complete and found to be negative unless listed in HPI.   EP Information / Studies Reviewed:    EKG is not ordered today. EKG from 07/12/23 reviewed which showed AV paced. V paced morphology consistent with conduction system pacing.       Risk Assessment/Calculations:    CHA2DS2-VASc Score = 3   This indicates a 3.2% annual risk of stroke. The patient's score is based upon: CHF History: 0 HTN History: 1 Diabetes History: 0 Stroke History: 0 Vascular Disease History: 0 Age Score: 2 Gender Score: 0             Physical Exam:   VS:  BP 110/68 (BP Location: Left Arm, Patient Position: Sitting, Cuff Size: Large)   Pulse 68   Ht 5\' 9"  (1.753 m)   Wt 215 lb (97.5 kg)   SpO2 97%   BMI 31.75 kg/m    Wt Readings from Last 3 Encounters:  07/26/23 215 lb (97.5 kg)  07/12/23 215 lb (97.5 kg)  09/28/22 215 lb (97.5 kg)      GEN: Well nourished, well developed in no acute distress NECK: No JVD CARDIAC: Normal rate, regular rhythm RESPIRATORY:  Clear to auscultation without rales, wheezing or rhonchi  ABDOMEN: Soft, non-distended EXTREMITIES:  No edema; No deformity   ASSESSMENT AND PLAN:    #. S/p dual chamber pacemaker:  #. Sinus node dysfunction syndrome #. First degree AV block: #. Tachycardia-bradycardia syndrome:  - He has a His bundle lead with evidence of conduction system capture on ECG (QRS ). This was programmed with a pulse width of 1.31ms. Unfortunately, this has led to significant battery depletion over time. He had an estimated 11 months remaining, for a 2019 implant.  - In clinic device check performed. Appropriate function and stable lead parameters. His underlying rhythm is sinus bradycardia with 1:1 AV conduction but long first degree AV delay, so he is AP-VP >95%. We were able to make some changes to his RV lead output and pulse width to extend longevity and maintain safety margins.  - Anticipate ERI in ~ 1 year. Continue remote monitoring in the interim.  #. Paroxysmal atrial fibrillation: AT/AF burden <0.1% on PPM interrogation. #. Secondary hypercoagulable state due to atrial fibrillation: CHADSVASC score of 3.  -Continue metoprolol XL 100mg  daily.  -Continue Eliquis.  -Monitor burden on PPM.  #. Hypertension -At goal today.  Recommend checking blood pressures 1-2 times per week at home  and recording the values.  Recommend bringing these recordings to the primary care physician.  Follow up with Dr. Jimmey Ralph  in 6-12 months.   Signed, Nobie Putnam, MD

## 2023-09-27 ENCOUNTER — Telehealth: Payer: Self-pay | Admitting: Cardiovascular Disease

## 2023-09-27 NOTE — Telephone Encounter (Signed)
   Pre-operative Risk Assessment    Patient Name: [name]  DOB: 1941-03-23 MRN: [id number]   Date of last office visit: 07/12/23 Date of next office visit: n/a   Request for Surgical Clearance    Procedure:  Upper Endoscopy  Date of Surgery:  Clearance 12/26/23                                Surgeon:  Lavon Pound Surgeon's Group or Practice Name:  [facility] Phone number:  [phone] Fax number:  [phone]   Type of Clearance Requested:   - Pharmacy:  Hold Apixaban  (Eliquis )     Type of Anesthesia:  Not Indicated   Additional requests/questions:    Signed, Fletcher Humble   09/27/2023, 2:18 PM

## 2023-09-30 NOTE — Telephone Encounter (Signed)
 Patient with diagnosis of afib on Eliquis  for anticoagulation.    Procedure: Upper Endoscopy  Date of procedure: 12/26/2023   CHA2DS2-VASc Score = 3   This indicates a 3.2% annual risk of stroke. The patient's score is based upon: CHF History: 0 HTN History: 1 Diabetes History: 0 Stroke History: 0 Vascular Disease History: 0 Age Score: 2 Gender Score: 0     CrCl 73 mL/min Platelet count 178 K   Patient has not had an Afib/aflutter ablation within the last 3 months or DCCV within the last 30 days   Per office protocol, patient can hold Eliquis  for 1-2 days prior to procedure.     **This guidance is not considered finalized until pre-operative APP has relayed final recommendations.**

## 2023-09-30 NOTE — Telephone Encounter (Signed)
   Name: HASSANI LAMBROU  DOB: 1941-01-30  MRN: [id number]   Primary Cardiologist: None  Chart reviewed as part of pre-operative protocol coverage.   Per Pharm D, patient may hold Eliquis  for 1-2 days prior to procedure.    I will route this recommendation to the requesting party via Epic fax function and remove from pre-op pool. Please call with questions.  Morey Ar, NP 09/30/2023, 3:38 PM

## 2023-12-15 ENCOUNTER — Encounter: Payer: Self-pay | Admitting: *Deleted

## 2023-12-26 ENCOUNTER — Encounter
Admission: RE | Disposition: A | Discharge status: Home / Self Care | Payer: Self-pay | Source: Home / Self Care | Attending: Gastroenterology

## 2023-12-26 ENCOUNTER — Other Ambulatory Visit: Payer: Self-pay

## 2023-12-26 ENCOUNTER — Ambulatory Visit: Admitting: Certified Registered"

## 2023-12-26 ENCOUNTER — Ambulatory Visit
Admission: RE | Admit: 2023-12-26 | Discharge: 2023-12-26 | Disposition: A | Discharge status: Home / Self Care | Attending: Gastroenterology | Admitting: Gastroenterology

## 2023-12-26 DIAGNOSIS — E66813 Obesity, class 3: Secondary | ICD-10-CM | POA: Insufficient documentation

## 2023-12-26 DIAGNOSIS — I10 Essential (primary) hypertension: Secondary | ICD-10-CM | POA: Diagnosis not present

## 2023-12-26 DIAGNOSIS — E785 Hyperlipidemia, unspecified: Secondary | ICD-10-CM | POA: Diagnosis not present

## 2023-12-26 DIAGNOSIS — I4891 Unspecified atrial fibrillation: Secondary | ICD-10-CM | POA: Insufficient documentation

## 2023-12-26 DIAGNOSIS — K2270 Barrett's esophagus without dysplasia: Secondary | ICD-10-CM | POA: Insufficient documentation

## 2023-12-26 DIAGNOSIS — Z6831 Body mass index (BMI) 31.0-31.9, adult: Secondary | ICD-10-CM | POA: Diagnosis not present

## 2023-12-26 DIAGNOSIS — K449 Diaphragmatic hernia without obstruction or gangrene: Secondary | ICD-10-CM | POA: Insufficient documentation

## 2023-12-26 HISTORY — DX: Trigger finger, left little finger: M65.352

## 2023-12-26 HISTORY — DX: Other hemorrhoids: K64.8

## 2023-12-26 HISTORY — DX: Bradycardia, unspecified: R00.1

## 2023-12-26 HISTORY — DX: Fatty (change of) liver, not elsewhere classified: K76.0

## 2023-12-26 HISTORY — DX: Rectal polyp: K62.1

## 2023-12-26 HISTORY — DX: Rheumatoid arthritis without rheumatoid factor, multiple sites: M06.09

## 2023-12-26 HISTORY — DX: Brachial plexus disorders: G54.0

## 2023-12-26 HISTORY — DX: Personal history of other malignant neoplasm of skin: Z85.828

## 2023-12-26 HISTORY — DX: Prediabetes: R73.03

## 2023-12-26 HISTORY — DX: Major depressive disorder, single episode, in full remission: F32.5

## 2023-12-26 HISTORY — DX: Ventricular premature depolarization: I49.3

## 2023-12-26 HISTORY — DX: Unspecified atrial fibrillation: I48.91

## 2023-12-26 SURGERY — EGD (ESOPHAGOGASTRODUODENOSCOPY)
Anesthesia: General

## 2023-12-26 MED ORDER — LIDOCAINE HCL (CARDIAC) PF 100 MG/5ML IV SOSY
PREFILLED_SYRINGE | INTRAVENOUS | Status: DC | PRN
  Administered 2023-12-26 (×2): 100 mg via INTRAVENOUS

## 2023-12-26 MED ORDER — SODIUM CHLORIDE 0.9 % IV SOLN: INTRAVENOUS | Status: DC

## 2023-12-26 MED ORDER — PROPOFOL 500 MG/50ML IV EMUL
INTRAVENOUS | Status: DC | PRN
  Administered 2023-12-26 (×3): 50 mg via INTRAVENOUS

## 2023-12-26 NOTE — Op Note (Signed)
 [facility] Gastroenterology Patient Name: [name] Procedure Date: 12/26/2023 10:35 AM MRN: [id number] Account #: [id number] Date of Birth: [birth year] Admit Type: Outpatient Age: 83 Room: [facility] ENDO ROOM 3 Gender: Male Note Status: Finalized Instrument Name: Upper Endoscope [id number] Procedure:             Upper GI endoscopy Indications:           Barrett's esophagus Providers:             Ole Schick MD, MD Referring MD:          Ole Schick MD, MD (Referring MD), Layman ORN.                         Lenon MD, MD (Referring MD) Medicines:             Monitored Anesthesia Care Complications:         No immediate complications. Estimated blood loss:                         Minimal. Procedure:             Pre-Anesthesia Assessment:                        - Prior to the procedure, a History and Physical was                         performed, and patient medications and allergies were                         reviewed. The patient is competent. The risks and                         benefits of the procedure and the sedation options and                         risks were discussed with the patient. All questions                         were answered and informed consent was obtained.                         Patient identification and proposed procedure were                         verified by the physician, the nurse, the                         anesthesiologist, the anesthetist and the technician                         in the endoscopy suite. Mental Status Examination:                         alert and oriented. Airway Examination: normal                         oropharyngeal airway and neck mobility. Respiratory  Examination: clear to auscultation. CV Examination:                         normal. Prophylactic Antibiotics: The patient does not                         require prophylactic antibiotics. Prior                          Anticoagulants: The patient has taken no anticoagulant                         or antiplatelet agents. ASA Grade Assessment: III - A                         patient with severe systemic disease. After reviewing                         the risks and benefits, the patient was deemed in                         satisfactory condition to undergo the procedure. The                         anesthesia plan was to use monitored anesthesia care                         (MAC). Immediately prior to administration of                         medications, the patient was re-assessed for adequacy                         to receive sedatives. The heart rate, respiratory                         rate, oxygen saturations, blood pressure, adequacy of                         pulmonary ventilation, and response to care were                         monitored throughout the procedure. The physical                         status of the patient was re-assessed after the                         procedure.                        After obtaining informed consent, the endoscope was                         passed under direct vision. Throughout the procedure,                         the patient's blood pressure, pulse, and oxygen  saturations were monitored continuously. The                         Endosonoscope was introduced through the mouth, and                         advanced to the second part of duodenum. The upper GI                         endoscopy was accomplished without difficulty. The                         patient tolerated the procedure well. Findings:      There were esophageal mucosal changes classified as Barrett's stage       C1-M2 per Prague criteria present in the lower third of the esophagus.       The maximum longitudinal extent of these mucosal changes was 2 cm in       length. Mucosa was biopsied with a cold forceps for histology in 4       quadrants at intervals of 2 cm  in the lower third of the esophagus. One       specimen bottle was sent to pathology. Estimated blood loss was minimal.      A small hiatal hernia was present.      The entire examined stomach was normal.      The examined duodenum was normal. Impression:            - Esophageal mucosal changes classified as Barrett's                         stage C1-M2 per Prague criteria. Biopsied.                        - Small hiatal hernia.                        - Normal stomach.                        - Normal examined duodenum. Recommendation:        - Discharge patient to home.                        - Resume previous diet.                        - Continue present medications.                        - Await pathology results.                        - Return to referring physician as previously                         scheduled. Procedure Code(s):     --- Professional ---                        [phone], Esophagogastroduodenoscopy, flexible,  transoral; with biopsy, single or multiple Diagnosis Code(s):     --- Professional ---                        K22.70, Barrett's esophagus without dysplasia                        K44.9, Diaphragmatic hernia without obstruction or                         gangrene CPT copyright 2022 American Medical Association. All rights reserved. The codes documented in this report are preliminary and upon coder review may  be revised to meet current compliance requirements. Ole Schick MD, MD 12/26/2023 11:13:10 AM Number of Addenda: 0 Note Initiated On: 12/26/2023 10:35 AM Estimated Blood Loss:  Estimated blood loss was minimal.      [facility]

## 2023-12-26 NOTE — Interval H&P Note (Signed)
 History and Physical Interval Note:  12/26/2023 10:50 AM  [name]  has presented today for surgery, with the diagnosis of Barrett's esophagus without dysplasia (K22.70) Gastric polyp (K31.7).  The various methods of treatment have been discussed with the patient and family. After consideration of risks, benefits and other options for treatment, the patient has consented to  Procedure(s): EGD (ESOPHAGOGASTRODUODENOSCOPY) (N/A) as a surgical intervention.  The patient's history has been reviewed, patient examined, no change in status, stable for surgery.  I have reviewed the patient's chart and labs.  Questions were answered to the patient's satisfaction.     Ole ONEIDA Schick  Ok to proceed with EGD

## 2023-12-26 NOTE — Anesthesia Postprocedure Evaluation (Signed)
 Anesthesia Post Note  Patient: [name]  Procedure(s) Performed: EGD (ESOPHAGOGASTRODUODENOSCOPY)  Patient location during evaluation: PACU Anesthesia Type: General Level of consciousness: awake and awake and alert Pain management: satisfactory to patient Vital Signs Assessment: post-procedure vital signs reviewed and stable Respiratory status: spontaneous breathing Cardiovascular status: stable Anesthetic complications: no   There were no known notable events for this encounter.   Last Vitals:  Vitals:   12/26/23 1125 12/26/23 1152  BP: 92/69 118/70  Pulse: 63   Resp: 15 20  Temp:    SpO2: 99%     Last Pain:  Vitals:   12/26/23 1152  TempSrc:   PainSc: 0-No pain                 VAN STAVEREN,Danee Soller

## 2023-12-26 NOTE — H&P (Signed)
 Outpatient short stay form Pre-procedure 12/26/2023  Ole ONEIDA Schick, MD  Primary Physician: Lenon Layman ORN, MD  Reason for visit:  Barrett's esophagus  History of present illness:    83 y/o gentleman with history of hypertension, HLD, and a. Fib on DOAC with last dose 3 days ago. History of laminectomy. No family history of GI malignancies.    Current Facility-Administered Medications:    0.9 %  sodium chloride  infusion, , Intravenous, Continuous, Fama Muenchow, Ole ONEIDA, MD, Last Rate: 20 mL/hr at 12/26/23 1021, New Bag at 12/26/23 1021  Medications Prior to Admission  Medication Sig Dispense Refill Last Dose/Taking   amLODipine (NORVASC) 2.5 MG tablet Take 2.5 mg by mouth daily.   12/26/2023 Morning   atorvastatin  (LIPITOR) 40 MG tablet Take 40 mg by mouth daily.   12/25/2023   losartan (COZAAR) 100 MG tablet Take 1 tablet by mouth daily.   12/26/2023 Morning   metoprolol succinate (TOPROL-XL) 100 MG 24 hr tablet Take 100 mg by mouth daily.   12/26/2023 Morning   omeprazole (PRILOSEC) 20 MG capsule Take 20 mg by mouth daily.    12/26/2023 Morning   Cholecalciferol  (VITAMIN D3) 2000 units capsule Take 2,000 Units by mouth daily.      cyclobenzaprine (FLEXERIL) 5 MG tablet Please specify directions, refills and quantity (Patient not taking: Reported on 07/12/2023) 30 tablet 0    diphenhydrAMINE  (BENADRYL ) 25 MG tablet Take 25 mg by mouth at bedtime as needed for sleep.       ELIQUIS  5 MG TABS tablet Take 5 mg by mouth every 12 (twelve) hours.  0 12/23/2023   eplerenone (INSPRA) 25 MG tablet Take 1 tablet by mouth daily.      finasteride  (PROSCAR ) 5 MG tablet Take 5 mg by mouth daily.      folic acid  (FOLVITE ) 800 MCG tablet Take 800 mcg by mouth daily.       gabapentin  (NEURONTIN ) 100 MG capsule Take 3 capsules (300 mg total) by mouth at bedtime. 30 capsule 1    Magnesium Chloride-Calcium  (SLOW MAGNESIUM/CALCIUM  PO) Take 420 mg by mouth daily.      methotrexate (RHEUMATREX) 2.5 MG tablet Take  20 mg by mouth every Sunday.       methylPREDNISolone  (MEDROL ) 4 MG tablet Medrol  dose pack. Take as instructed (Patient not taking: Reported on 12/26/2023) 21 tablet 0 Not Taking   neomycin -polymyxin-hydrocortisone (CORTISPORIN) 3.5-10000-1 OTIC suspension Apply 1-2 drops daily after soaking and cover with bandaid (Patient not taking: Reported on 07/12/2023) 10 mL 0    nystatin ointment (MYCOSTATIN) Apply 1 Application topically 2 (two) times daily as needed.      oxyCODONE  (OXY IR/ROXICODONE ) 5 MG immediate release tablet Take 1-2 tablets (5-10 mg total) by mouth every 6 (six) hours as needed for moderate pain (pain score 4-6). (Patient not taking: Reported on 07/12/2023) 30 tablet 0    tamsulosin  (FLOMAX ) 0.4 MG CAPS capsule Take 0.4 mg by mouth daily.       tizanidine  (ZANAFLEX ) 2 MG capsule Take 1 capsule (2 mg total) by mouth 3 (three) times daily. (Patient not taking: Reported on 07/12/2023) 40 capsule 0    traZODone  (DESYREL ) 50 MG tablet Take 50 mg by mouth at bedtime as needed for sleep.       triamcinolone  cream (KENALOG ) 0.1 % Apply 1 Application topically as needed.      Wheat Dextrin (BENEFIBER) POWD Take 30 mLs by mouth daily. 2 tablespoons        No Known Allergies  Past Medical History:  Diagnosis Date   Arthritis    Atrial fibrillation (HCC)    Barrett's esophagus    BPH (benign prostatic hyperplasia)    BPPV (benign paroxysmal positional vertigo)    Brachial plexus neuropathy    Bradycardia    Colorectal polyps    Fatty infiltration of liver    HBP (high blood pressure)    History of skin cancer    Hyperlipidemia    Internal hemorrhoids    Major depression in remission (HCC)    Pre-diabetes    Prediabetes    Presence of permanent cardiac pacemaker    RBBB (right bundle branch block with left anterior fascicular block)    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    Physical Exam  Gen: Alert, oriented. Appears stated age.  HEENT: PERRLA. Lungs: No respiratory distress CV: RRR Abd: soft, benign, no masses Ext: No edema    Planned procedures: Proceed with EGD. The patient understands the nature of the planned procedure, indications, risks, alternatives and potential complications including but not limited to bleeding, infection, perforation, damage to internal organs and possible oversedation/side effects from anesthesia. The patient agrees and gives consent to proceed.  Please refer to procedure notes for findings, recommendations and patient disposition/instructions.     Ole ONEIDA Schick, MD [facility] Gastroenterology

## 2023-12-26 NOTE — Anesthesia Preprocedure Evaluation (Signed)
 Anesthesia Evaluation  Patient identified by MRN, date of birth, ID band Patient awake    Reviewed: Allergy & Precautions, NPO status , Patient's Chart, lab work & pertinent test results  Airway Mallampati: III  TM Distance: >3 FB Neck ROM: full    Dental  (+) Teeth Intact   Pulmonary neg pulmonary ROS, Patient abstained from smoking., former smoker   Pulmonary exam normal breath sounds clear to auscultation       Cardiovascular Exercise Tolerance: Good hypertension, Pt. on medications negative cardio ROS Normal cardiovascular exam+ dysrhythmias Atrial Fibrillation + pacemaker  Rhythm:Regular Rate:Normal     Neuro/Psych    Depression    negative neurological ROS  negative psych ROS   GI/Hepatic negative GI ROS, Neg liver ROS,,,  Endo/Other  negative endocrine ROS  Class 3 obesity  Renal/GU negative Renal ROS  negative genitourinary   Musculoskeletal   Abdominal   Peds negative pediatric ROS (+)  Hematology negative hematology ROS (+)   Anesthesia Other Findings Past Medical History: No date: Arthritis No date: Atrial fibrillation (HCC) No date: Barrett's esophagus No date: BPH (benign prostatic hyperplasia) No date: BPPV (benign paroxysmal positional vertigo) No date: Brachial plexus neuropathy No date: Bradycardia No date: Colorectal polyps No date: Fatty infiltration of liver No date: HBP (high blood pressure) No date: History of skin cancer No date: Hyperlipidemia No date: Internal hemorrhoids No date: Major depression in remission (HCC) No date: Pre-diabetes No date: Prediabetes No date: Presence of permanent cardiac pacemaker No date: RBBB (right bundle branch block with left anterior  fascicular block) No date: Rheumatoid arthritis of multiple sites without rheumatoid  factor (HCC) No date: Seborrheic keratosis No date: Traumatic leg injury No date: Trigger little finger of left hand No date:  Unifocal PVCs No date: Unspecified atrial fibrillation [facility])  Past Surgical History: No date: COLONOSCOPY; N/A 03/27/2015: ESOPHAGOGASTRODUODENOSCOPY (EGD) WITH PROPOFOL ; N/A     Comment:  Procedure: ESOPHAGOGASTRODUODENOSCOPY (EGD) WITH               PROPOFOL ;  Surgeon: Deward CINDERELLA [place], MD;  Location: [facility]               ENDOSCOPY;  Service: Gastroenterology;  Laterality: N/A; No date: INSERT / REPLACE / REMOVE PACEMAKER No date: SHOULDER SURGERY; Bilateral No date: TONSILLECTOMY 10/27/2018: TOTAL HIP ARTHROPLASTY; Left     Comment:  Procedure: LEFT TOTAL HIP ARTHROPLASTY ANTERIOR               APPROACH;  Surgeon: Vernetta Lonni CINDERELLA, MD;                Location: [facility];  Service: Orthopedics;  Laterality:               Left;  BMI    Body Mass Index: 31.60 kg/m      Reproductive/Obstetrics negative OB ROS                              Anesthesia Physical Anesthesia Plan  ASA: 3  Anesthesia Plan: General   Post-op Pain Management:    Induction: Intravenous  PONV Risk Score and Plan: Propofol  infusion and TIVA  Airway Management Planned: Natural Airway and Nasal Cannula  Additional Equipment:   Intra-op Plan:   Post-operative Plan:   Informed Consent: I have reviewed the patients History and Physical, chart, labs and discussed the procedure including the risks, benefits and alternatives for the proposed anesthesia with the  patient or authorized representative who has indicated his/her understanding and acceptance.     Dental Advisory Given  Plan Discussed with: Anesthesiologist, CRNA and Surgeon  Anesthesia Plan Comments:         Anesthesia Quick Evaluation

## 2023-12-26 NOTE — Transfer of Care (Signed)
 Immediate Anesthesia Transfer of Care Note  Patient: [name]  Procedure(s) Performed: EGD (ESOPHAGOGASTRODUODENOSCOPY)  Patient Location: PACU  Anesthesia Type:General  Level of Consciousness: awake and patient cooperative  Airway & Oxygen Therapy: Patient Spontanous Breathing  Post-op Assessment: Report given to RN and Post -op Vital signs reviewed and stable  Post vital signs: stable  Last Vitals:  Vitals Value Taken Time  BP 118/70 12/26/23 11:52  Temp 35.4 C 12/26/23 11:12  Pulse 59 12/26/23 11:44  Resp 20 12/26/23 11:52  SpO2 99 % 12/26/23 11:44  Vitals shown include unfiled device data.  Last Pain:  Vitals:   12/26/23 1152  TempSrc:   PainSc: 0-No pain         Complications: No notable events documented.

## 2023-12-27 ENCOUNTER — Encounter: Payer: Self-pay | Admitting: Gastroenterology

## 2023-12-27 LAB — SURGICAL PATHOLOGY

## 2024-03-26 ENCOUNTER — Encounter: Payer: Self-pay | Admitting: Cardiology

## 2024-03-26 ENCOUNTER — Telehealth: Payer: Self-pay | Admitting: Cardiology

## 2024-03-26 ENCOUNTER — Encounter: Payer: Self-pay | Admitting: Radiology

## 2024-03-26 NOTE — Telephone Encounter (Signed)
Message sent to device clinic.

## 2024-03-26 NOTE — Telephone Encounter (Signed)
 Patient recently received remote monitoring from [facility] by [employer], but was informed that he was changing care from [facility] to [facility] with Dr. Kennyth. Please call patient at earliest convenience. Please advise. Thank you.

## 2024-03-26 NOTE — Telephone Encounter (Addendum)
 Attempted outreach to [facility] [phone] to request release of patient for remote monitoring purposes to [facility].   No answer as they close at 4pm according to recording.   Unable to leave voicemail.  Routing to device clinic phone calls for someone to follow up tomorrow if possible.

## 2024-03-27 NOTE — Telephone Encounter (Signed)
 Outreach made to [employer] representative to get Pt's serial number for device.  Serial # [id number].  Entered transfer in request in [facility].  Outreach made to [employer].  Left message requesting release of Pt in [facility].  Will schedule remotes after Pt is released.  Message sent to scheduler to schedule follow up for Pt in [place].  Will continue to monitor [facility].

## 2024-03-29 NOTE — Telephone Encounter (Signed)
 Message left with [facility] at [facility] requesting release patient from [facility]. States she will send a message to the EP team.

## 2024-04-16 NOTE — Progress Notes (Unsigned)
 Electrophysiology Clinic Note    Date:  04/17/2024  Patient ID:  [name] October 24, 1940, MRN [id number] PCP:  Lenon Layman ORN, MD  Cardiologist:  None  Electrophysiologist:  Fonda Kitty, MD  Electrophysiology APP:  Zayda Angell, NP    Discussed the use of AI scribe software for clinical note transcription with the patient, who gave verbal consent to proceed.   Patient Profile    Chief Complaint: PPM follow-up  History of Present Illness: [name] is a 83 y.o. male with PMH notable for SND, tachy-brady s/p PPM, parox Afib, HTN, HLD, tobacco use; seen today for Fonda Kitty, MD for routine electrophysiology followup.   He last saw Dr. Kitty 07/2023 to establish care and transfer his pacemaker follow-up to heart care. He has a HIS bundle RV lead with elevated threshold. Low Afib burden on PPM, on eliquis .   On follow-up today, he is overall doing well from a cardiac standpoint.  Dr. Gollan recently reduced his losartan dose d/t fatigue and low BP.  His blood pressure is now running 110-120s systolic and his energy has improved.  He is not aware of any atrial fibrillation episodes.  He continues to take Eliquis  twice daily without bleeding concerns.     Arrhythmia/Device History MDT dual chamber PPM, imp 2019; dx tachy-brady HIS bundle lead    ROS:  Please see the history of present illness. All other systems are reviewed and otherwise negative.    Physical Exam    VS:  BP 110/62 (BP Location: Left Arm, Patient Position: Sitting, Cuff Size: Normal)   Pulse 69   Ht 5' 9 (1.753 m)   Wt 219 lb 9.6 oz (99.6 kg)   SpO2 97%   BMI 32.43 kg/m  BMI: Body mass index is 32.43 kg/m.           Wt Readings from Last 3 Encounters:  04/17/24 219 lb 9.6 oz (99.6 kg)  12/26/23 214 lb (97.1 kg)  07/26/23 215 lb (97.5 kg)     GEN- The patient is well appearing, alert and oriented x 3 today.   Lungs- Clear to ausculation bilaterally, normal work  of breathing.  Heart- Regular rate and rhythm, no murmurs, rubs or gallops Extremities- No peripheral edema, warm, dry Skin-  device pocket well-healed, no tethering   Device interrogation done today and reviewed by myself:  Battery 6 months Lead thresholds, impedence, sensing stable  Programmed with short AV delay to promote VP VP 96.8% AFlutter episodes noted, longest 1.5 h  No changes made today   Studies Reviewed   Previous EP, cardiology notes.    EKG is ordered. Personal review of EKG from today shows:    EKG Interpretation Date/Time:  Tuesday April 17 2024 10:28:49 EST Ventricular Rate:  69 PR Interval:  92 QRS Duration:  116 QT Interval:  420 QTC Calculation: 450 R Axis:   141  Text Interpretation: AV dual-paced rhythm with occasional Premature ventricular complexes Confirmed by Briana Farner [phone]) on 04/17/2024 10:31:44 AM       Assessment and Plan     #) SND #) tachy-brady #) PPM in situ Device functioning well, see Paceart for details Battery estimate at 6 months, will increase remote monitoring to q. Monthly Stable lead thresholds Programmed with short AV delay to promote V pacing. Intrinsic QRS approximately 150 ms, paced QRS 116 ms We discussed generator replacement procedure in detail, patient prefers to have procedure done at [facility] when indicated   #)  parox Afib #) aflutter Overall low burden, patient is asymptomatic of atrial arrhythmia Continue 100 mg metoprolol daily   #) Hypercoag d/t parox afib CHA2DS2-VASc Score = at least 3 [CHF History: 0, HTN History: 1, Diabetes History: 0, Stroke History: 0, Vascular Disease History: 0, Age Score: 2, Gender Score: 0].  Therefore, the patient's annual risk of stroke is 3.2 %.    Stroke ppx - 5mg  eliquis  BID, appropriately dosed No bleeding concerns        Current medicines are reviewed at length with the patient today.   The patient does not have concerns regarding his medicines.  The  following changes were made today:  none  Labs/ tests ordered today include:  Orders Placed This Encounter  Procedures   EKG 12-Lead     Disposition: Follow up with Dr. Kennyth or EP APP in 6 months   Signed, Jazmynn Pho, NP  04/17/24  12:45 PM  Electrophysiology [facility]

## 2024-04-17 ENCOUNTER — Encounter: Payer: Self-pay | Admitting: Cardiology

## 2024-04-17 ENCOUNTER — Ambulatory Visit: Attending: Cardiology | Admitting: Cardiology

## 2024-04-17 VITALS — BP 110/62 | HR 69 | Ht 69.0 in | Wt 219.6 lb

## 2024-04-17 DIAGNOSIS — I495 Sick sinus syndrome: Secondary | ICD-10-CM | POA: Diagnosis present

## 2024-04-17 DIAGNOSIS — I480 Paroxysmal atrial fibrillation: Secondary | ICD-10-CM | POA: Insufficient documentation

## 2024-04-17 DIAGNOSIS — D6869 Other thrombophilia: Secondary | ICD-10-CM | POA: Diagnosis present

## 2024-04-17 DIAGNOSIS — Z950 Presence of cardiac pacemaker: Secondary | ICD-10-CM | POA: Diagnosis not present

## 2024-04-17 LAB — CUP PACEART INCLINIC DEVICE CHECK
Date Time Interrogation Session: 20251125124712
Implantable Lead Connection Status: 753985
Implantable Lead Connection Status: 753985
Implantable Lead Implant Date: 20190918
Implantable Lead Implant Date: 20190918
Implantable Lead Location: 753859
Implantable Lead Location: 753860
Implantable Lead Model: 3830
Implantable Lead Model: 5076
Implantable Pulse Generator Implant Date: 20190918

## 2024-04-17 NOTE — Patient Instructions (Signed)
 Medication Instructions:  Your physician recommends that you continue on your current medications as directed. Please refer to the Current Medication list given to you today.   *If you need a refill on your cardiac medications before your next appointment, please call your pharmacy*  Lab Work: No labs ordered today  If you have labs (blood work) drawn today and your tests are completely normal, you will receive your results only by: MyChart Message (if you have MyChart) OR A paper copy in the mail If you have any lab test that is abnormal or we need to change your treatment, we will call you to review the results.  Testing/Procedures: No test ordered today   Follow-Up: At [facility], you and your health needs are our priority.  As part of our continuing mission to provide you with exceptional heart care, our providers are all part of one team.  This team includes your primary Cardiologist (physician) and Advanced Practice Providers or APPs (Physician Assistants and Nurse Practitioners) who all work together to provide you with the care you need, when you need it.  Your next appointment:   6 month(s)  Provider:   Suzann Riddle, NP or Fonda Kitty, MD   We recommend signing up for the patient portal called MyChart.  Sign up information is provided on this After Visit Summary.  MyChart is used to connect with patients for Virtual Visits (Telemedicine).  Patients are able to view lab/test results, encounter notes, upcoming appointments, etc.  Non-urgent messages can be sent to your provider as well.   To learn more about what you can do with MyChart, go to [email or web address].

## 2024-05-15 ENCOUNTER — Ambulatory Visit

## 2024-06-11 ENCOUNTER — Ambulatory Visit

## 2024-06-15 ENCOUNTER — Ambulatory Visit: Attending: Internal Medicine

## 2024-06-15 DIAGNOSIS — I495 Sick sinus syndrome: Secondary | ICD-10-CM

## 2024-06-15 LAB — CUP PACEART REMOTE DEVICE CHECK
Battery Remaining Longevity: 3 mo
Battery Voltage: 2.67 V
Brady Statistic AP VP Percent: 92.99 %
Brady Statistic AP VS Percent: 0.14 %
Brady Statistic AS VP Percent: 3.05 %
Brady Statistic AS VS Percent: 3.82 %
Brady Statistic RA Percent Paced: 92.4 %
Brady Statistic RV Percent Paced: 95.68 %
Date Time Interrogation Session: 20260122192926
Implantable Lead Connection Status: 753985
Implantable Lead Connection Status: 753985
Implantable Lead Implant Date: 20190918
Implantable Lead Implant Date: 20190918
Implantable Lead Location: 753859
Implantable Lead Location: 753860
Implantable Lead Model: 3830
Implantable Lead Model: 5076
Implantable Pulse Generator Implant Date: 20190918
Lead Channel Impedance Value: 285 Ohm
Lead Channel Impedance Value: 342 Ohm
Lead Channel Impedance Value: 361 Ohm
Lead Channel Impedance Value: 399 Ohm
Lead Channel Pacing Threshold Amplitude: 0.25 V
Lead Channel Pacing Threshold Amplitude: 0.625 V
Lead Channel Pacing Threshold Pulse Width: 0.4 ms
Lead Channel Pacing Threshold Pulse Width: 0.4 ms
Lead Channel Sensing Intrinsic Amplitude: 1.875 mV
Lead Channel Sensing Intrinsic Amplitude: 1.875 mV
Lead Channel Sensing Intrinsic Amplitude: 5.875 mV
Lead Channel Sensing Intrinsic Amplitude: 5.875 mV
Lead Channel Setting Pacing Amplitude: 1.5 V
Lead Channel Setting Pacing Amplitude: 3 V
Lead Channel Setting Pacing Pulse Width: 0.4 ms
Lead Channel Setting Sensing Sensitivity: 2.8 mV
Zone Setting Status: 755011
Zone Setting Status: 755011

## 2024-06-19 NOTE — Progress Notes (Signed)
 Remote pacemaker transmission.

## 2024-06-28 ENCOUNTER — Ambulatory Visit

## 2024-07-03 ENCOUNTER — Ambulatory Visit: Admitting: Cardiovascular Disease

## 2024-07-16 ENCOUNTER — Ambulatory Visit

## 2024-08-16 ENCOUNTER — Ambulatory Visit

## 2024-09-16 ENCOUNTER — Ambulatory Visit

## 2024-09-17 ENCOUNTER — Ambulatory Visit

## 2024-09-27 ENCOUNTER — Ambulatory Visit

## 2024-10-18 ENCOUNTER — Ambulatory Visit

## 2024-11-19 ENCOUNTER — Ambulatory Visit

## 2024-12-20 ENCOUNTER — Ambulatory Visit
